# Patient Record
Sex: Female | Born: 1985 | Race: Black or African American | Hispanic: No | Marital: Married | Smoking: Never smoker
Health system: Southern US, Community
[De-identification: ages and names within clinical notes are randomized; demographics above are authoritative.]

## PROBLEM LIST (undated history)

## (undated) DIAGNOSIS — Z789 Other specified health status: Secondary | ICD-10-CM

## (undated) HISTORY — PX: NO PAST SURGERIES: SHX2092

## (undated) HISTORY — DX: Other specified health status: Z78.9

---

## 2020-04-06 NOTE — L&D Delivery Note (Signed)
OB/GYN Faculty Practice Delivery Note  Vicki Waters is a 35 y.o. [id number] s/p SVD at [redacted]w[redacted]d. She was admitted for PROM and early labor.   ROM: 11h 48m with clear fluid GBS Status: Negative Maximum Maternal Temperature: 98.8  Labor Progress: Presented for PROM, was started on pitocin and progressed to complete  Delivery Date/Time: 2252 on 02/28/2021 Delivery: Called to room and patient was complete and pushing. Head delivered LOA. No nuchal cord present. Shoulder and body delivered in usual fashion. Infant with spontaneous cry, placed on mother's abdomen, dried and stimulated. Cord clamped x 2 after 1-minute delay, and cut by father of baby. Cord blood drawn. Placenta delivered spontaneously with gentle cord traction. Fundus firm with massage and Pitocin. Labia, perineum, vagina, and cervix inspected inspected and found to have a second degree laceration that was repaired with 3-0 vicryl in running fashion  Placenta: intact, 3V cord, to L&D Complications: NONE Lacerations: 2nd degree EBL: 100cc Analgesia: none   Infant: female  APGARs 7,9  2900g  Renard Matter, MD, MPH OB Fellow, [place] for [facility], [place] 02/28/2021, 9:49 AM

## 2020-09-24 ENCOUNTER — Encounter: Payer: Self-pay | Admitting: *Deleted

## 2020-10-08 ENCOUNTER — Encounter: Payer: Self-pay | Admitting: Certified Nurse Midwife

## 2020-10-08 ENCOUNTER — Other Ambulatory Visit (HOSPITAL_COMMUNITY)
Admission: RE | Admit: 2020-10-08 | Discharge: 2020-10-08 | Disposition: A | Discharge status: Home / Self Care | Payer: Self-pay | Source: Ambulatory Visit | Attending: Certified Nurse Midwife | Admitting: Certified Nurse Midwife

## 2020-10-08 ENCOUNTER — Other Ambulatory Visit: Payer: Self-pay

## 2020-10-08 ENCOUNTER — Ambulatory Visit (INDEPENDENT_AMBULATORY_CARE_PROVIDER_SITE_OTHER): Payer: Self-pay | Admitting: Certified Nurse Midwife

## 2020-10-08 VITALS — BP 103/67 | HR 87 | Wt 130.9 lb

## 2020-10-08 DIAGNOSIS — O0932 Supervision of pregnancy with insufficient antenatal care, second trimester: Secondary | ICD-10-CM

## 2020-10-08 DIAGNOSIS — Z3480 Encounter for supervision of other normal pregnancy, unspecified trimester: Secondary | ICD-10-CM | POA: Insufficient documentation

## 2020-10-08 DIAGNOSIS — Z3492 Encounter for supervision of normal pregnancy, unspecified, second trimester: Secondary | ICD-10-CM

## 2020-10-08 DIAGNOSIS — Z3A18 18 weeks gestation of pregnancy: Secondary | ICD-10-CM

## 2020-10-08 NOTE — Progress Notes (Signed)
   History:   Vicki Waters is a 35 y.o. G2P1001 at [redacted]w[redacted]d by LMP being seen today for her first obstetrical visit.  Her obstetrical history is significant for  nothing, her last pregnancy was uncomplicated. Last baby born in [place] . Patient does intend to breast feed. Pregnancy history fully reviewed.  Patient reports no complaints.   HISTORY: OB History  Gravida Para Term Preterm AB Living  2 1 1  0 0 1  SAB IAB Ectopic Multiple Live Births  0 0 0 0 1    # Outcome Date GA Lbr Len/2nd Weight Sex Delivery Anes PTL Lv  2 Current           1 Term 2018    M Vag-Spont   LIV    Does not think she has ever had a pap smear, will complete today.  Reviewed past medical, social and surgical histories.  No known drug allergies.  Pt not taking any medications  Review of Systems Pertinent items noted in HPI and remainder of comprehensive ROS otherwise negative. Physical Exam:   Vitals:   10/08/20 1600  BP: 103/67  Pulse: 87  Weight: 130 lb 14.4 oz (59.4 kg)   Fetal Heart Rate (bpm): 161  Constitutional: Well-developed, well-nourished pregnant female in no acute distress.  HEENT: PERRLA Skin: normal color and turgor, no rash Cardiovascular: normal rate & rhythm, no murmur Respiratory: normal effort, lung sounds clear throughout GI: Abd soft, non-tender, pos BS x 4, gravid appropriate for gestational age MS: Extremities nontender, no edema, normal ROM Neurologic: Alert and oriented x 4.  GU: no CVA tenderness Pelvic: NEFG, physiologic discharge, no blood, cervix clean. Pap/swabs collected FHR:   Assessment & Plan:  1. Supervision of low-risk pregnancy, second trimester - Doing well, starting to feel little fluttery fetal movement, nausea has subsided  2. [redacted] weeks gestation of pregnancy - Routine OB care - Culture, OB Urine - Cytology - PAP( [place])  3. Initial obstetric visit in second trimester Initial labs drawn. - CBC/D/Plt+RPR+Rh+ABO+RubIgG... - Genetic  Screening - Hemoglobin A1c - AFP, Serum, Open Spina Bifida Continue prenatal vitamins. Problem list reviewed and updated. Genetic Screening discussed, Natera and AFP ordered. Ultrasound discussed; fetal anatomic survey: ordered. Anticipatory guidance about prenatal visits given including labs, ultrasounds, and testing. Discussed usage of Babyscripts and virtual visits as additional source of managing and completing prenatal visits in midst of coronavirus and pandemic.   Encouraged to complete MyChart Registration for her ability to review results, send requests, and have questions addressed.  The nature of [place] [facility] Healthcare/Faculty Practice with multiple MDs and Advanced Practice Providers was explained to patient; also emphasized that residents, students are part of our team.  Routine obstetric precautions reviewed. Encouraged to seek out care at office or emergency room [facility] preferred) for urgent and/or emergent concerns. Return in about 4 weeks (around 11/05/2020) for IN-PERSON, LOB.     Gaylan Gerold, MSN, CNM, [place] Certified Nurse Midwife, [facility]

## 2020-10-09 NOTE — Addendum Note (Signed)
Addended by: Gaylan Gerold on: 10/09/2020 01:21 PM   Modules accepted: Orders

## 2020-10-10 LAB — AFP, SERUM, OPEN SPINA BIFIDA
AFP MoM: 0.74
AFP Value: 43.4 ng/mL
Gest. Age on Collection Date: 18.5 weeks
Maternal Age At EDD: 33 yr
OSBR Risk 1 IN: 10000
Test Results:: NEGATIVE
Weight: 130 [lb_av]

## 2020-10-10 LAB — CBC/D/PLT+RPR+RH+ABO+RUBIGG...
Antibody Screen: NEGATIVE
Basophils Absolute: 0 10*3/uL (ref 0.0–0.2)
Basos: 0 %
EOS (ABSOLUTE): 0.1 10*3/uL (ref 0.0–0.4)
Eos: 1 %
HCV Ab: <0.1 s/co ratio (ref 0.0–0.9)
HIV Screen 4th Generation wRfx: NONREACTIVE
Hematocrit: 34.2 % (ref 34.0–46.6)
Hemoglobin: 11.7 g/dL (ref 11.1–15.9)
Hepatitis B Surface Ag: NEGATIVE
Immature Grans (Abs): 0.1 10*3/uL (ref 0.0–0.1)
Immature Granulocytes: 1 %
Lymphocytes Absolute: 1.8 10*3/uL (ref 0.7–3.1)
Lymphs: 21 %
MCH: 28.6 pg (ref 26.6–33.0)
MCHC: 34.2 g/dL (ref 31.5–35.7)
MCV: 84 fL (ref 79–97)
Monocytes Absolute: 0.4 10*3/uL (ref 0.1–0.9)
Monocytes: 5 %
Neutrophils Absolute: 6.3 10*3/uL (ref 1.4–7.0)
Neutrophils: 72 %
Platelets: 246 10*3/uL (ref 150–450)
RBC: 4.09 x10E6/uL (ref 3.77–5.28)
RDW: 14.2 % (ref 11.7–15.4)
RPR Ser Ql: NONREACTIVE
Rh Factor: NEGATIVE
Rubella Antibodies, IGG: 6.29 index (ref >0.99)
WBC: 8.8 10*3/uL (ref 3.4–10.8)

## 2020-10-10 LAB — CYTOLOGY - PAP
Comment: NEGATIVE
Diagnosis: NEGATIVE
High risk HPV: NEGATIVE

## 2020-10-10 LAB — HCV INTERPRETATION

## 2020-10-10 LAB — CULTURE, OB URINE

## 2020-10-10 LAB — URINE CULTURE, OB REFLEX

## 2020-10-10 LAB — HEMOGLOBIN A1C
Est. average glucose Bld gHb Est-mCnc: 97 mg/dL
Hgb A1c MFr Bld: 5 % (ref 4.8–5.6)

## 2020-10-16 ENCOUNTER — Other Ambulatory Visit: Payer: Self-pay

## 2020-10-16 ENCOUNTER — Ambulatory Visit: Payer: Self-pay | Attending: Certified Nurse Midwife

## 2020-10-16 DIAGNOSIS — Z3492 Encounter for supervision of normal pregnancy, unspecified, second trimester: Secondary | ICD-10-CM | POA: Insufficient documentation

## 2020-10-16 DIAGNOSIS — O0932 Supervision of pregnancy with insufficient antenatal care, second trimester: Secondary | ICD-10-CM | POA: Insufficient documentation

## 2020-10-16 DIAGNOSIS — Z3A18 18 weeks gestation of pregnancy: Secondary | ICD-10-CM | POA: Insufficient documentation

## 2020-10-23 ENCOUNTER — Encounter: Payer: Self-pay | Admitting: *Deleted

## 2020-10-24 ENCOUNTER — Telehealth: Payer: Self-pay

## 2020-10-24 DIAGNOSIS — Z148 Genetic carrier of other disease: Secondary | ICD-10-CM | POA: Insufficient documentation

## 2020-10-24 NOTE — Telephone Encounter (Signed)
Called Pt to go over Horizon test results, Pt states she did see results online & just assumed that she would go over results at her upcoming Dr. Visit. Advised that if she wanted to speak with a Dietitian at [place], I could give her the number & she accepted.

## 2020-10-31 ENCOUNTER — Other Ambulatory Visit: Payer: Self-pay

## 2020-10-31 ENCOUNTER — Ambulatory Visit (INDEPENDENT_AMBULATORY_CARE_PROVIDER_SITE_OTHER): Payer: Self-pay

## 2020-10-31 VITALS — BP 108/67 | HR 103 | Wt 136.0 lb

## 2020-10-31 DIAGNOSIS — Z3492 Encounter for supervision of normal pregnancy, unspecified, second trimester: Secondary | ICD-10-CM

## 2020-10-31 DIAGNOSIS — O26899 Other specified pregnancy related conditions, unspecified trimester: Secondary | ICD-10-CM

## 2020-10-31 DIAGNOSIS — Z362 Encounter for other antenatal screening follow-up: Secondary | ICD-10-CM

## 2020-10-31 DIAGNOSIS — Z3A22 22 weeks gestation of pregnancy: Secondary | ICD-10-CM

## 2020-10-31 DIAGNOSIS — Z6791 Unspecified blood type, Rh negative: Secondary | ICD-10-CM

## 2020-10-31 NOTE — Progress Notes (Signed)
   PRENATAL VISIT NOTE  Subjective:  Vicki Waters is a 35 y.o. G2P1001 at 27w0dbeing seen today for ongoing prenatal care.  She is currently monitored for the following issues for this low-risk pregnancy and has Supervision of other normal pregnancy, antepartum and Carrier of spinal muscular atrophy on their problem list.  Patient reports no complaints.  Contractions: Not present. Vag. Bleeding: None.  Movement: Present. Denies leaking of fluid.   The following portions of the patient's history were reviewed and updated as appropriate: allergies, current medications, past family history, past medical history, past social history, past surgical history and problem list.   Objective:   Vitals:   10/31/20 0939  BP: 108/67  Pulse: (!) 103  Weight: 136 lb (61.7 kg)    Fetal Status: Fetal Heart Rate (bpm): 155   Movement: Present     General:  Alert, oriented and cooperative. Patient is in no acute distress.  Skin: Skin is warm and dry. No rash noted.   Cardiovascular: Normal heart rate noted  Respiratory: Normal respiratory effort, no problems with respiration noted  Abdomen: Soft, gravid, appropriate for gestational age.  Pain/Pressure: Present     Pelvic: Cervical exam deferred        Extremities: Normal range of motion.  Edema: None  Mental Status: Normal mood and affect. Normal behavior. Normal judgment and thought content.   Assessment and Plan:  Pregnancy: G2P1001 at 247w0d1. Supervision of low-risk pregnancy, second trimester Routine OB care. No concerns today Endorses active fetal movement   2. [redacted] weeks gestation of pregnancy Follow up anatomy ultrasound scheduled on 11/25/2020   3. Rh negative state in antepartum period Rhogam at 28 weeks   Preterm labor symptoms and general obstetric precautions including but not limited to vaginal bleeding, contractions, leaking of fluid and fetal movement were reviewed in detail with the patient. Please refer to After Visit  Summary for other counseling recommendations.     Return in about 4 weeks (around 11/28/2020).   Future Appointments  Date Time Provider [place]8/22/2022  8:00 AM [facility] US1 [facility] [place] 10/31/20 10:47 AM

## 2020-11-25 ENCOUNTER — Other Ambulatory Visit: Payer: Self-pay

## 2020-11-25 ENCOUNTER — Ambulatory Visit: Payer: Self-pay

## 2020-11-25 ENCOUNTER — Other Ambulatory Visit: Payer: Self-pay | Admitting: *Deleted

## 2020-11-25 DIAGNOSIS — Z362 Encounter for other antenatal screening follow-up: Secondary | ICD-10-CM | POA: Insufficient documentation

## 2020-12-04 DIAGNOSIS — Z6791 Unspecified blood type, Rh negative: Secondary | ICD-10-CM | POA: Insufficient documentation

## 2020-12-04 DIAGNOSIS — O26899 Other specified pregnancy related conditions, unspecified trimester: Secondary | ICD-10-CM | POA: Insufficient documentation

## 2020-12-05 ENCOUNTER — Encounter: Payer: Self-pay | Admitting: Family Medicine

## 2020-12-05 ENCOUNTER — Other Ambulatory Visit: Payer: Self-pay

## 2020-12-05 ENCOUNTER — Ambulatory Visit (INDEPENDENT_AMBULATORY_CARE_PROVIDER_SITE_OTHER): Payer: Self-pay | Admitting: Family Medicine

## 2020-12-05 VITALS — BP 111/70 | HR 93 | Wt 145.7 lb

## 2020-12-05 DIAGNOSIS — Z3A27 27 weeks gestation of pregnancy: Secondary | ICD-10-CM

## 2020-12-05 DIAGNOSIS — Z3480 Encounter for supervision of other normal pregnancy, unspecified trimester: Secondary | ICD-10-CM

## 2020-12-05 DIAGNOSIS — O3480 Maternal care for other abnormalities of pelvic organs, unspecified trimester: Secondary | ICD-10-CM

## 2020-12-05 DIAGNOSIS — O26899 Other specified pregnancy related conditions, unspecified trimester: Secondary | ICD-10-CM

## 2020-12-05 DIAGNOSIS — D279 Benign neoplasm of unspecified ovary: Secondary | ICD-10-CM

## 2020-12-05 DIAGNOSIS — Z6791 Unspecified blood type, Rh negative: Secondary | ICD-10-CM

## 2020-12-05 DIAGNOSIS — Z148 Genetic carrier of other disease: Secondary | ICD-10-CM

## 2020-12-05 DIAGNOSIS — Z23 Encounter for immunization: Secondary | ICD-10-CM

## 2020-12-05 MED ORDER — RHO D IMMUNE GLOBULIN 1500 UNIT/2ML IJ SOSY
300.0000 ug | PREFILLED_SYRINGE | Freq: Once | INTRAMUSCULAR | Status: AC
  Administered 2020-12-05: 300 ug via INTRAMUSCULAR

## 2020-12-05 NOTE — Progress Notes (Signed)
    Subjective:  Vicki Waters is a 35 y.o. G2P1001 at 63w0dbeing seen today for ongoing prenatal care.  She is currently monitored for the following issues for this low-risk pregnancy and has Supervision of other normal pregnancy, antepartum; Carrier of spinal muscular atrophy; and Rh negative state in antepartum period on their problem list.  Patient reports no complaints.  Contractions: Not present. Vag. Bleeding: None.  Movement: Present. Denies leaking of fluid.   She is Rh negative, believes FOB is RH positive.   The following portions of the patient's history were reviewed and updated as appropriate: allergies, current medications, past family history, past medical history, past social history, past surgical history and problem list.   Objective:   Vitals:   12/05/20 0829  BP: 111/70  Pulse: 93  Weight: 145 lb 11.2 oz (66.1 kg)    Fetal Status: Fetal Heart Rate (bpm): 154 Fundal Height: 27 cm Movement: Present     General:  Alert, oriented and cooperative. Patient is in no acute distress.  Skin: Skin is warm and dry. No rash noted.   Cardiovascular: Normal heart rate noted  Respiratory: Normal respiratory effort, no problems with respiration noted  Abdomen: Soft, gravid, appropriate for gestational age. Pain/Pressure: Absent     Pelvic:  Cervical exam deferred        Extremities: Normal range of motion.  Edema: None  Mental Status: Normal mood and affect. Normal behavior. Normal judgment and thought content.     Assessment and Plan:  Pregnancy: G2P1001 at 24w0d1. Supervision of other normal pregnancy, antepartum Doing well.  - RPR - HIV Antibody (routine testing w rflx) - CBC - Tdap vaccine greater than or equal to 7yo IM - Glucose Tolerance, 2 Hours w/1 Hour  2. [redacted] weeks gestation of pregnancy 3rd trimester labs as above.   3. Rh negative state in antepartum period RhoGAM administered today.   4. Carrier of spinal muscular atrophy FOB testing recommended.  Genetic counseling scheduled by MFM.   5. Ovarian dermoid cyst complicating pregnancy, antepartum Right location, two small cysts. She has occasion irritation in that area since being told they were present in 10/2020. Counseled again on s/sx of ovarian torsion.   Preterm labor symptoms and general obstetric precautions including but not limited to vaginal bleeding, contractions, leaking of fluid and fetal movement were reviewed in detail with the patient. Please refer to After Visit Summary for other counseling recommendations.   Return in about 3 weeks (around 12/26/2020) for [place]  BePatriciaann ClanDO

## 2020-12-05 NOTE — Progress Notes (Signed)
Patient declines flu shot at this time.   Altha Harm, CMA

## 2020-12-06 LAB — CBC
Hematocrit: 36.8 % (ref 34.0–46.6)
Hemoglobin: 11.9 g/dL (ref 11.1–15.9)
MCH: 27.7 pg (ref 26.6–33.0)
MCHC: 32.3 g/dL (ref 31.5–35.7)
MCV: 86 fL (ref 79–97)
Platelets: 235 x10E3/uL (ref 150–450)
RBC: 4.3 x10E6/uL (ref 3.77–5.28)
RDW: 13.2 % (ref 11.7–15.4)
WBC: 7.7 x10E3/uL (ref 3.4–10.8)

## 2020-12-06 LAB — HIV ANTIBODY (ROUTINE TESTING W REFLEX): HIV Screen 4th Generation wRfx: NONREACTIVE

## 2020-12-06 LAB — ANTIBODY SCREEN: Antibody Screen: NEGATIVE

## 2020-12-06 LAB — GLUCOSE TOLERANCE, 2 HOURS W/ 1HR
Glucose, 1 hour: 170 mg/dL (ref 65–179)
Glucose, 2 hour: 134 mg/dL (ref 65–152)
Glucose, Fasting: 90 mg/dL (ref 65–91)

## 2020-12-06 LAB — RPR: RPR Ser Ql: NONREACTIVE

## 2020-12-17 ENCOUNTER — Ambulatory Visit: Payer: Self-pay

## 2020-12-24 ENCOUNTER — Other Ambulatory Visit: Payer: Self-pay

## 2020-12-24 ENCOUNTER — Ambulatory Visit (INDEPENDENT_AMBULATORY_CARE_PROVIDER_SITE_OTHER): Payer: Self-pay | Admitting: Student

## 2020-12-24 VITALS — BP 115/72 | HR 96 | Wt 148.0 lb

## 2020-12-24 DIAGNOSIS — Z3A29 29 weeks gestation of pregnancy: Secondary | ICD-10-CM

## 2020-12-24 DIAGNOSIS — Z3480 Encounter for supervision of other normal pregnancy, unspecified trimester: Secondary | ICD-10-CM

## 2020-12-24 NOTE — Progress Notes (Signed)
Patient ID: Vicki Waters, female   DOB: May 18, 1985, 35 y.o.   MRN: [id number]   PRENATAL VISIT NOTE  Subjective:  Vicki Waters is a 35 y.o. G2P1001 at [redacted]w[redacted]d being seen today for ongoing prenatal care.  She is currently monitored for the following issues for this low-risk pregnancy and has Supervision of other normal pregnancy, antepartum; Carrier of spinal muscular atrophy; and Rh negative state in antepartum period on their problem list.  Patient reports no complaints.  Contractions: Not present. Vag. Bleeding: None.  Movement: Present. Denies leaking of fluid.   The following portions of the patient's history were reviewed and updated as appropriate: allergies, current medications, past family history, past medical history, past social history, past surgical history and problem list.   Objective:   Vitals:   12/24/20 1036  BP: 115/72  Pulse: 96  Weight: 148 lb (67.1 kg)    Fetal Status: Fetal Heart Rate (bpm): 160   Movement: Present     General:  Alert, oriented and cooperative. Patient is in no acute distress.  Skin: Skin is warm and dry. No rash noted.   Cardiovascular: Normal heart rate noted  Respiratory: Normal respiratory effort, no problems with respiration noted  Abdomen: Soft, gravid, appropriate for gestational age.  Pain/Pressure: Absent     Pelvic: Cervical exam deferred        Extremities: Normal range of motion.  Edema: None  Mental Status: Normal mood and affect. Normal behavior. Normal judgment and thought content.   Assessment and Plan:  Pregnancy: G2P1001 at [redacted]w[redacted]d 1. Supervision of other normal pregnancy, antepartum -doing well, no complaints -declined genetic counseling  -OB box updated -given information on IUD Preterm labor symptoms and general obstetric precautions including but not limited to vaginal bleeding, contractions, leaking of fluid and fetal movement were reviewed in detail with the patient. Please refer to After Visit Summary for other  counseling recommendations.   Return in about 3 weeks (around 01/14/2021), or LROb with [place].  No future appointments.  [place], CNM

## 2021-01-14 ENCOUNTER — Ambulatory Visit (INDEPENDENT_AMBULATORY_CARE_PROVIDER_SITE_OTHER): Payer: Self-pay | Admitting: Student

## 2021-01-14 ENCOUNTER — Other Ambulatory Visit: Payer: Self-pay

## 2021-01-14 VITALS — BP 113/70 | HR 109 | Wt 154.1 lb

## 2021-01-14 DIAGNOSIS — Z3480 Encounter for supervision of other normal pregnancy, unspecified trimester: Secondary | ICD-10-CM

## 2021-01-14 DIAGNOSIS — Z3A32 32 weeks gestation of pregnancy: Secondary | ICD-10-CM

## 2021-01-14 NOTE — Progress Notes (Signed)
   PRENATAL VISIT NOTE  Subjective:  Vicki Waters is a 35 y.o. G2P1001 at [redacted]w[redacted]d being seen today for ongoing prenatal care.  She is currently monitored for the following issues for this low-risk pregnancy and has Supervision of other normal pregnancy, antepartum; Carrier of spinal muscular atrophy; and Rh negative state in antepartum period on their problem list.  Patient reports no complaints.  Contractions: Not present. Vag. Bleeding: None.  Movement: Present. Denies leaking of fluid.   The following portions of the patient's history were reviewed and updated as appropriate: allergies, current medications, past family history, past medical history, past social history, past surgical history and problem list.   Objective:   Vitals:   01/14/21 1044  BP: 113/70  Pulse: (!) 109  Weight: 154 lb 1.6 oz (69.9 kg)    Fetal Status: Fetal Heart Rate (bpm): 165 Fundal Height: 31 cm Movement: Present     General:  Alert, oriented and cooperative. Patient is in no acute distress.  Skin: Skin is warm and dry. No rash noted.   Cardiovascular: Normal heart rate noted  Respiratory: Normal respiratory effort, no problems with respiration noted  Abdomen: Soft, gravid, appropriate for gestational age.  Pain/Pressure: Absent     Pelvic: Cervical exam deferred        Extremities: Normal range of motion.  Edema: Trace  Mental Status: Normal mood and affect. Normal behavior. Normal judgment and thought content.   Assessment and Plan:  Pregnancy: G2P1001 at [redacted]w[redacted]d 1. Supervision of other normal pregnancy, antepartum -doing well, no questions -discussed IUD; she is thinking about it. Discussed that she can get in the hospital before she leaves  Preterm labor symptoms and general obstetric precautions including but not limited to vaginal bleeding, contractions, leaking of fluid and fetal movement were reviewed in detail with the patient. Please refer to After Visit Summary for other counseling  recommendations.   Return in about 2 weeks (around 01/28/2021), or LROB wiht [place] or other APP.  Future Appointments  Date Time Provider [place]  02/03/2021 10:35 AM Caren Macadam, MD [facility]    [place], [place]

## 2021-02-03 ENCOUNTER — Other Ambulatory Visit: Payer: Self-pay

## 2021-02-03 ENCOUNTER — Ambulatory Visit (INDEPENDENT_AMBULATORY_CARE_PROVIDER_SITE_OTHER): Payer: Self-pay | Admitting: Family Medicine

## 2021-02-03 VITALS — BP 115/70 | HR 107 | Wt 158.1 lb

## 2021-02-03 DIAGNOSIS — Z3480 Encounter for supervision of other normal pregnancy, unspecified trimester: Secondary | ICD-10-CM

## 2021-02-03 DIAGNOSIS — Z6791 Unspecified blood type, Rh negative: Secondary | ICD-10-CM

## 2021-02-03 DIAGNOSIS — Z148 Genetic carrier of other disease: Secondary | ICD-10-CM

## 2021-02-03 DIAGNOSIS — O26899 Other specified pregnancy related conditions, unspecified trimester: Secondary | ICD-10-CM

## 2021-02-03 NOTE — Progress Notes (Signed)
   PRENATAL VISIT NOTE  Subjective:  Vicki Waters is a 35 y.o. G2P1001 at [redacted]w[redacted]d being seen today for ongoing prenatal care.  She is currently monitored for the following issues for this low-risk pregnancy and has Supervision of other normal pregnancy, antepartum; Carrier of spinal muscular atrophy; and Rh negative state in antepartum period on their problem list.  Patient reports no complaints.  Contractions: Not present. Vag. Bleeding: None.  Movement: Present. Denies leaking of fluid.   The following portions of the patient's history were reviewed and updated as appropriate: allergies, current medications, past family history, past medical history, past social history, past surgical history and problem list.   Objective:   Vitals:   02/03/21 1043  BP: 115/70  Pulse: (!) 107  Weight: 158 lb 1.6 oz (71.7 kg)    Fetal Status: Fetal Heart Rate (bpm): 147 Fundal Height: 35 cm Movement: Present     General:  Alert, oriented and cooperative. Patient is in no acute distress.  Skin: Skin is warm and dry. No rash noted.   Cardiovascular: Normal heart rate noted  Respiratory: Normal respiratory effort, no problems with respiration noted  Abdomen: Soft, gravid, appropriate for gestational age.  Pain/Pressure: Absent     Pelvic: Cervical exam deferred        Extremities: Normal range of motion.  Edema: Trace  Mental Status: Normal mood and affect. Normal behavior. Normal judgment and thought content.   Assessment and Plan:  Pregnancy: G2P1001 at [redacted]w[redacted]d 1. Rh negative state in antepartum period Received rhogam  2. Supervision of other normal pregnancy, antepartum Up to date Has some minor spotting at 34 weeks- 1 spot on toilet paper and pink for a couple days.  Discussed labs next visit Reviewed contraception in depth Discussed IUD vs Nexplanon. Patient has IUD in [place]- seems likely it was copper IUD as ti caused increased bleeding/cramping but patient unsure. Gave bedsider website to  review Plans to BF  3. Carrier of spinal muscular atrophy   Preterm labor symptoms and general obstetric precautions including but not limited to vaginal bleeding, contractions, leaking of fluid and fetal movement were reviewed in detail with the patient. Please refer to After Visit Summary for other counseling recommendations.   Return in about 1 week (around 02/10/2021) for Routine prenatal care, MD or APP.  Future Appointments  Date Time Provider [place]  02/12/2021  1:55 PM Rasch, Artist Pais, NP [facility]  02/19/2021 10:15 AM Radene Gunning, MD [facility]    Caren Macadam, MD

## 2021-02-12 ENCOUNTER — Ambulatory Visit (INDEPENDENT_AMBULATORY_CARE_PROVIDER_SITE_OTHER): Payer: Medicaid Other | Admitting: Obstetrics and Gynecology

## 2021-02-12 ENCOUNTER — Other Ambulatory Visit (HOSPITAL_COMMUNITY)
Admission: RE | Admit: 2021-02-12 | Discharge: 2021-02-12 | Disposition: A | Discharge status: Home / Self Care | Payer: Self-pay | Source: Ambulatory Visit | Attending: Obstetrics and Gynecology | Admitting: Obstetrics and Gynecology

## 2021-02-12 ENCOUNTER — Other Ambulatory Visit: Payer: Self-pay

## 2021-02-12 VITALS — BP 119/78 | HR 115 | Wt 158.8 lb

## 2021-02-12 DIAGNOSIS — Z3480 Encounter for supervision of other normal pregnancy, unspecified trimester: Secondary | ICD-10-CM | POA: Insufficient documentation

## 2021-02-12 DIAGNOSIS — Z23 Encounter for immunization: Secondary | ICD-10-CM | POA: Diagnosis not present

## 2021-02-12 NOTE — Progress Notes (Signed)
Flu vaccine was requested by patient. Routine allergy questions were asked and vaccine was administered into right deltoid without any complications. There were questions or concerns.    Zella Richer, [place]   02/12/21  3:06pm

## 2021-02-12 NOTE — Progress Notes (Signed)
   PRENATAL VISIT NOTE  Subjective:  Vicki Waters is a 35 y.o. G2P1001 at [redacted]w[redacted]d being seen today for ongoing prenatal care.  She is currently monitored for the following issues for this low-risk pregnancy and has Supervision of other normal pregnancy, antepartum; Carrier of spinal muscular atrophy; and Rh negative state in antepartum period on their problem list.  Patient reports no complaints.  Contractions: Not present. Vag. Bleeding: None.  Movement: Present. Denies leaking of fluid.   The following portions of the patient's history were reviewed and updated as appropriate: allergies, current medications, past family history, past medical history, past social history, past surgical history and problem list.   Objective:   Vitals:   02/12/21 1443  BP: 119/78  Pulse: (!) 115  Weight: 158 lb 12.8 oz (72 kg)    Fetal Status: Fetal Heart Rate (bpm): 159 Fundal Height: 37 cm Movement: Present     General:  Alert, oriented and cooperative. Patient is in no acute distress.  Skin: Skin is warm and dry. No rash noted.   Cardiovascular: Normal heart rate noted  Respiratory: Normal respiratory effort, no problems with respiration noted  Abdomen: Soft, gravid, appropriate for gestational age.  Pain/Pressure: Absent     Pelvic: Cervical exam deferred        Extremities: Normal range of motion.  Edema: Trace  Mental Status: Normal mood and affect. Normal behavior. Normal judgment and thought content.   Assessment and Plan:  Pregnancy: G2P1001 at [redacted]w[redacted]d  1. Supervision of other normal pregnancy, antepartum  - Doing well, no complaints.  - Culture, beta strep (group b only) - GC/Chlamydia probe amp ([place])not at [facility]    Preterm labor symptoms and general obstetric precautions including but not limited to vaginal bleeding, contractions, leaking of fluid and fetal movement were reviewed in detail with the patient. Please refer to After Visit Summary for other counseling  recommendations.   No follow-ups on file.  Future Appointments  Date Time Provider [place]  02/19/2021 10:15 AM Radene Gunning, MD [facility] [place], NP

## 2021-02-13 LAB — GC/CHLAMYDIA PROBE AMP (~~LOC~~) NOT AT ARMC
Chlamydia: NEGATIVE
Comment: NEGATIVE
Comment: NORMAL
Neisseria Gonorrhea: NEGATIVE

## 2021-02-15 LAB — CULTURE, BETA STREP (GROUP B ONLY): Strep Gp B Culture: NEGATIVE

## 2021-02-18 NOTE — Progress Notes (Signed)
   PRENATAL VISIT NOTE  Subjective:  Vicki Waters is a 35 y.o. G2P1001 at [redacted]w[redacted]d being seen today for ongoing prenatal care.  She is currently monitored for the following issues for this low-risk pregnancy and has Supervision of other normal pregnancy, antepartum; Carrier of spinal muscular atrophy; and Rh negative state in antepartum period on their problem list.  Patient reports  right foot is swollen. Denies calf pain and notes legs are the same size .  Contractions: Irritability. Vag. Bleeding: None.  Movement: Present. Denies leaking of fluid.   The following portions of the patient's history were reviewed and updated as appropriate: allergies, current medications, past family history, past medical history, past social history, past surgical history and problem list.   Objective:   Vitals:   02/19/21 0931  BP: 117/84  Pulse: (!) 112  Weight: 159 lb (72.1 kg)    Fetal Status: Fetal Heart Rate (bpm): 156 Fundal Height: 37 cm Movement: Present     General:  Alert, oriented and cooperative. Patient is in no acute distress.  Skin: Skin is warm and dry. No rash noted.   Cardiovascular: Normal heart rate noted  Respiratory: Normal respiratory effort, no problems with respiration noted  Abdomen: Soft, gravid, appropriate for gestational age.  Pain/Pressure: Absent     Pelvic: Cervical exam deferred        Extremities: Normal range of motion.  Edema: None  Mental Status: Normal mood and affect. Normal behavior. Normal judgment and thought content.   Assessment and Plan:  Pregnancy: G2P1001 at [redacted]w[redacted]d 1. Supervision of other normal pregnancy, antepartum - GBS negative - Plans labor without epidural - Discussed birth control - she would like cuIUD. She is undecided about ppIUD vs outpt. We discussed the pros/cons of each one and she will consider.   2. Carrier of spinal muscular atrophy - She was offered genetic counseling but they ultimately declined  3. Rh negative state in  antepartum period - Rhogam given per routine  Term labor symptoms and general obstetric precautions including but not limited to vaginal bleeding, contractions, leaking of fluid and fetal movement were reviewed in detail with the patient. Please refer to After Visit Summary for other counseling recommendations.   Return in about 1 week (around 02/26/2021) for OB VISIT, MD or APP - may be virtual.  Future Appointments  Date Time Provider [place]  02/19/2021 10:15 AM Radene Gunning, MD [facility]    Radene Gunning, MD

## 2021-02-19 ENCOUNTER — Ambulatory Visit (INDEPENDENT_AMBULATORY_CARE_PROVIDER_SITE_OTHER): Payer: Self-pay | Admitting: Obstetrics and Gynecology

## 2021-02-19 ENCOUNTER — Other Ambulatory Visit: Payer: Self-pay

## 2021-02-19 ENCOUNTER — Encounter: Payer: Self-pay | Admitting: Obstetrics and Gynecology

## 2021-02-19 VITALS — BP 117/84 | HR 112 | Wt 159.0 lb

## 2021-02-19 DIAGNOSIS — O26899 Other specified pregnancy related conditions, unspecified trimester: Secondary | ICD-10-CM

## 2021-02-19 DIAGNOSIS — Z6791 Unspecified blood type, Rh negative: Secondary | ICD-10-CM

## 2021-02-19 DIAGNOSIS — Z148 Genetic carrier of other disease: Secondary | ICD-10-CM

## 2021-02-19 DIAGNOSIS — Z3480 Encounter for supervision of other normal pregnancy, unspecified trimester: Secondary | ICD-10-CM

## 2021-02-26 ENCOUNTER — Telehealth (INDEPENDENT_AMBULATORY_CARE_PROVIDER_SITE_OTHER): Payer: Self-pay | Admitting: Family Medicine

## 2021-02-26 DIAGNOSIS — Z3A38 38 weeks gestation of pregnancy: Secondary | ICD-10-CM

## 2021-02-26 DIAGNOSIS — O360130 Maternal care for anti-D [Rh] antibodies, third trimester, not applicable or unspecified: Secondary | ICD-10-CM

## 2021-02-26 DIAGNOSIS — Z6791 Unspecified blood type, Rh negative: Secondary | ICD-10-CM

## 2021-02-26 DIAGNOSIS — Z3480 Encounter for supervision of other normal pregnancy, unspecified trimester: Secondary | ICD-10-CM

## 2021-02-26 DIAGNOSIS — Z148 Genetic carrier of other disease: Secondary | ICD-10-CM

## 2021-02-26 NOTE — Progress Notes (Signed)
I connected with  Vicki Waters on 02/26/21 at 1530 by telephone and verified that I am speaking with the correct person using two identifiers.Patient instructed to join MyChart video.   Patient joined video visit. I discussed the limitations, risks, security and privacy concerns of performing an evaluation and management service by telephone and the availability of in person appointments. I also discussed with the patient that there may be a patient responsible charge related to this service. The patient expressed understanding and agreed to proceed.  Annabell Howells, RN 02/26/2021  3:32 PM

## 2021-02-26 NOTE — Progress Notes (Signed)
   OBSTETRICS PRENATAL VIRTUAL VISIT ENCOUNTER NOTE  Provider location: [facility] [place]   Patient location: Home  I connected with Vicki Waters on 02/26/21 at  3:55 PM EST by MyChart Video Encounter and verified that I am speaking with the correct person using two identifiers. I discussed the limitations, risks, security and privacy concerns of performing an evaluation and management service virtually and the availability of in person appointments. I also discussed with the patient that there may be a patient responsible charge related to this service. The patient expressed understanding and agreed to proceed. Subjective:  Vicki Waters is a 35 y.o. G2P1001 at [redacted]w[redacted]d being seen today for ongoing prenatal care.  She is currently monitored for the following issues for this low-risk pregnancy and has Supervision of other normal pregnancy, antepartum; Carrier of spinal muscular atrophy; and Rh negative state in antepartum period on their problem list.  Patient reports occasional contractions.  Contractions: Irritability. Vag. Bleeding: None.  Movement: Present. Denies any leaking of fluid.   The following portions of the patient's history were reviewed and updated as appropriate: allergies, current medications, past family history, past medical history, past social history, past surgical history and problem list.   Objective:  There were no vitals filed for this visit.  Fetal Status:     Movement: Present     General:  Alert, oriented and cooperative. Patient is in no acute distress.  Respiratory: Normal respiratory effort, no problems with respiration noted  Mental Status: Normal mood and affect. Normal behavior. Normal judgment and thought content.  Rest of physical exam deferred due to type of encounter  Imaging: No results found.  Assessment and Plan:  Pregnancy: G2P1001 at [redacted]w[redacted]d 1. Supervision of other normal pregnancy, antepartum Good fetal  movement  2. Carrier of spinal muscular atrophy  3. Rh negative state in antepartum period Rhogam at 28 weeks  Term labor symptoms and general obstetric precautions including but not limited to vaginal bleeding, contractions, leaking of fluid and fetal movement were reviewed in detail with the patient. I discussed the assessment and treatment plan with the patient. The patient was provided an opportunity to ask questions and all were answered. The patient agreed with the plan and demonstrated an understanding of the instructions. The patient was advised to call back or seek an in-person office evaluation/go to MAU at [facility] for any urgent or concerning symptoms. Please refer to After Visit Summary for other counseling recommendations.   I provided 5 minutes of face-to-face time during this encounter.  No follow-ups on file.  Future Appointments  Date Time Provider [place]  02/26/2021  3:55 PM Truett Mainland, DO [facility] [place] for [employer], [place]

## 2021-02-27 ENCOUNTER — Other Ambulatory Visit: Payer: Self-pay

## 2021-02-27 ENCOUNTER — Encounter (HOSPITAL_COMMUNITY): Payer: Self-pay | Admitting: Obstetrics & Gynecology

## 2021-02-27 ENCOUNTER — Inpatient Hospital Stay (HOSPITAL_COMMUNITY)
Admission: AD | Admit: 2021-02-27 | Discharge: 2021-03-01 | DRG: 807 | Disposition: A | Discharge status: Home / Self Care | Payer: Medicaid Other | Attending: Obstetrics & Gynecology | Admitting: Obstetrics & Gynecology

## 2021-02-27 DIAGNOSIS — O4292 Full-term premature rupture of membranes, unspecified as to length of time between rupture and onset of labor: Secondary | ICD-10-CM | POA: Diagnosis present

## 2021-02-27 DIAGNOSIS — O4202 Full-term premature rupture of membranes, onset of labor within 24 hours of rupture: Secondary | ICD-10-CM | POA: Diagnosis not present

## 2021-02-27 DIAGNOSIS — Z20822 Contact with and (suspected) exposure to covid-19: Secondary | ICD-10-CM | POA: Diagnosis present

## 2021-02-27 DIAGNOSIS — Z3480 Encounter for supervision of other normal pregnancy, unspecified trimester: Secondary | ICD-10-CM

## 2021-02-27 DIAGNOSIS — Z6791 Unspecified blood type, Rh negative: Secondary | ICD-10-CM

## 2021-02-27 DIAGNOSIS — Z3A39 39 weeks gestation of pregnancy: Secondary | ICD-10-CM

## 2021-02-27 DIAGNOSIS — O26893 Other specified pregnancy related conditions, third trimester: Secondary | ICD-10-CM | POA: Diagnosis present

## 2021-02-27 DIAGNOSIS — Z148 Genetic carrier of other disease: Secondary | ICD-10-CM | POA: Diagnosis not present

## 2021-02-27 DIAGNOSIS — O26899 Other specified pregnancy related conditions, unspecified trimester: Secondary | ICD-10-CM

## 2021-02-27 LAB — RESP PANEL BY RT-PCR (FLU A&B, COVID) ARPGX2
Influenza A by PCR: NEGATIVE
Influenza B by PCR: NEGATIVE
SARS Coronavirus 2 by RT PCR: NEGATIVE

## 2021-02-27 LAB — CBC
HCT: 37.9 % (ref 36.0–46.0)
Hemoglobin: 12.7 g/dL (ref 12.0–15.0)
MCH: 27.8 pg (ref 26.0–34.0)
MCHC: 33.5 g/dL (ref 30.0–36.0)
MCV: 82.9 fL (ref 80.0–100.0)
Platelets: 215 10*3/uL (ref 150–400)
RBC: 4.57 MIL/uL (ref 3.87–5.11)
RDW: 13.7 % (ref 11.5–15.5)
WBC: 7.3 10*3/uL (ref 4.0–10.5)
nRBC: 0 % (ref 0.0–0.2)

## 2021-02-27 LAB — TYPE AND SCREEN
ABO/RH(D): O NEG
Antibody Screen: NEGATIVE

## 2021-02-27 LAB — POCT FERN TEST: POCT Fern Test: POSITIVE

## 2021-02-27 MED ORDER — LACTATED RINGERS IV SOLN: INTRAVENOUS | Status: DC

## 2021-02-27 MED ORDER — FENTANYL CITRATE (PF) 100 MCG/2ML IJ SOLN: 50.0000 ug | INTRAMUSCULAR | Status: DC | PRN

## 2021-02-27 MED ORDER — LIDOCAINE HCL (PF) 1 % IJ SOLN
30.0000 mL | INTRAMUSCULAR | Status: AC | PRN
  Administered 2021-02-27: 30 mL via SUBCUTANEOUS
  Filled 2021-02-27: qty 30

## 2021-02-27 MED ORDER — SOD CITRATE-CITRIC ACID 500-334 MG/5ML PO SOLN: 30.0000 mL | ORAL | Status: DC | PRN

## 2021-02-27 MED ORDER — OXYTOCIN-SODIUM CHLORIDE 30-0.9 UT/500ML-% IV SOLN
1.0000 m[IU]/min | INTRAVENOUS | Status: DC
  Administered 2021-02-27: 2 m[IU]/min via INTRAVENOUS

## 2021-02-27 MED ORDER — HYDROXYZINE HCL 50 MG PO TABS: 50.0000 mg | ORAL_TABLET | Freq: Four times a day (QID) | ORAL | Status: DC | PRN

## 2021-02-27 MED ORDER — OXYCODONE-ACETAMINOPHEN 5-325 MG PO TABS: 1.0000 | ORAL_TABLET | ORAL | Status: DC | PRN

## 2021-02-27 MED ORDER — LACTATED RINGERS IV SOLN: 500.0000 mL | INTRAVENOUS | Status: DC | PRN

## 2021-02-27 MED ORDER — FLEET ENEMA 7-19 GM/118ML RE ENEM: 1.0000 | ENEMA | RECTAL | Status: DC | PRN

## 2021-02-27 MED ORDER — OXYTOCIN BOLUS FROM INFUSION
333.0000 mL | Freq: Once | INTRAVENOUS | Status: AC
  Administered 2021-02-27: 333 mL via INTRAVENOUS

## 2021-02-27 MED ORDER — ACETAMINOPHEN 325 MG PO TABS: 650.0000 mg | ORAL_TABLET | ORAL | Status: DC | PRN

## 2021-02-27 MED ORDER — OXYTOCIN-SODIUM CHLORIDE 30-0.9 UT/500ML-% IV SOLN
2.5000 [IU]/h | INTRAVENOUS | Status: DC
  Filled 2021-02-27: qty 500

## 2021-02-27 MED ORDER — TERBUTALINE SULFATE 1 MG/ML IJ SOLN: 0.2500 mg | Freq: Once | INTRAMUSCULAR | Status: DC | PRN

## 2021-02-27 MED ORDER — ONDANSETRON HCL 4 MG/2ML IJ SOLN: 4.0000 mg | Freq: Four times a day (QID) | INTRAMUSCULAR | Status: DC | PRN

## 2021-02-27 MED ORDER — OXYCODONE-ACETAMINOPHEN 5-325 MG PO TABS: 2.0000 | ORAL_TABLET | ORAL | Status: DC | PRN

## 2021-02-27 NOTE — H&P (Addendum)
Vicki Waters is a 35 y.o. female G2P1001 with IUP at [redacted]w[redacted]d by LMP presenting for ROM at 1150.  Not feeling any contractions.  She reports positive fetal movement. She denies vaginal bleeding.  Prenatal History/Complications: PNC at [facility] Pregnancy complications:  - Past Medical History: Past Medical History:  Diagnosis Date   Medical history non-contributory     Past Surgical History: Past Surgical History:  Procedure Laterality Date   NO PAST SURGERIES      Obstetrical History: OB History     Gravida  2   Para  1   Term  1   Preterm      AB      Living  1      SAB      IAB      Ectopic      Multiple      Live Births  1            Social History: Social History   Socioeconomic History   Marital status: Married    Spouse name: Glynda Jaeger   Number of children: 1   Years of education: Not on file   Highest education level: Not on file  Occupational History   Not on file  Tobacco Use   Smoking status: Never   Smokeless tobacco: Never  Vaping Use   Vaping Use: Never used  Substance and Sexual Activity   Alcohol use: Never   Drug use: Never   Sexual activity: Yes  Other Topics Concern   Not on file  Social History Narrative   Not on file   Social Determinants of Health   Financial Resource Strain: Not on file  Food Insecurity: Not on file  Transportation Needs: Not on file  Physical Activity: Not on file  Stress: Not on file  Social Connections: Not on file    Family History: History reviewed. No pertinent family history.  Allergies: No Known Allergies  Medications Prior to Admission  Medication Sig Dispense Refill Last Dose   Prenatal Vit-Fe Fumarate-FA (MULTIVITAMIN-PRENATAL) 27-0.8 MG TABS tablet Take 1 tablet by mouth daily at 12 noon.   Past Week    Review of Systems   Constitutional: Negative for fever and chills Eyes: Negative for visual disturbances Respiratory: Negative for shortness of breath,  dyspnea Cardiovascular: Negative for chest pain or palpitations  Gastrointestinal: Negative for vomiting, diarrhea and constipation.  POSITIVE for abdominal pain (contractions) Genitourinary: Negative for dysuria and urgency Musculoskeletal: Negative for back pain, joint pain, myalgias  Neurological: Negative for dizziness and headaches  Blood pressure 135/73, pulse 98, temperature 98.1 F (36.7 C), temperature source Oral, resp. rate 16, height 5' 4.17" (1.63 m), weight 71.7 kg, last menstrual period 05/30/2020, SpO2 98 %. General appearance: alert, cooperative, and no distress Lungs: normal respiratory effort Heart: regular rate and rhythm Abdomen: soft, non-tender; bowel sounds normal Extremities: Homans sign is negative, no sign of DVT DTR's 2+ Presentation: cephalic Fetal monitoring  Baseline: 150 bpm, Variability: Good {> 6 bpm), Accelerations: Reactive, and Decelerations: Absent Uterine activity  None Dilation: 3 Effacement (%): 60 Station: -2 Exam by:: Mary Martinique Johnson, RN   Prenatal labs: ABO, Rh: --/--/O NEG (11/24 1324) Antibody: NEG (11/24 1324) Rubella: 6.29 (07/05 1647) RPR: Non Reactive (09/01 0909)  HBsAg: Negative (07/05 1647)  HIV: Non Reactive (09/01 0909)  GBS: Negative/-- (11/09 1515)   Nursing Staff Provider  Office Location  [facility] Dating  LMP  Language  English Anatomy US   normal  Flu  Vaccine  02/12/21 Genetic/Carrier Screen  NIPS:   Low risk female  AFP:   Negative  Horizon: Carrier for Automatic Data   TDaP Vaccine  12/05/20 Hgb A1C or  GTT Early  Third trimester  & Units 1 mo ago    Glucose, Fasting 65 - 91 mg/dL 90   Glucose, 1 hour 65 - 179 mg/dL 170   Glucose, 2 hour 65 - 152 mg/dL 134      COVID Vaccine 09/04/20   LAB RESULTS   Rhogam  12/05/20 Blood Type O/Negative/-- (07/05 1647)   Baby Feeding Plan Breast Antibody Negative (09/01 0909)  Contraception CuIUD Rubella 6.29 (07/05 1647)  Circumcision No RPR Non Reactive (09/01 0909)   Pediatrician   Mustard seed HBsAg Negative (07/05 1647)   Support Person wendwosen (FOB) HCVAb   Prenatal Classes  HIV Non Reactive (09/01 0909)     BTL Consent NA GBS   (For PCN allergy, check sensitivities)   VBAC Consent NA Pap 10/2020- Normal        DME Rx [ ]  BP cuff [ ]  Weight Scale Waterbirth  [ ]  Class [ ]  Consent [ ]  CNM visit  PHQ9 & GAD7 [x  ] new OB [  ] 28 weeks  [  ] 36 weeks Induction  [ ]  Orders Entered [ ] Foley Y/N   Prenatal Transfer Tool  Maternal Diabetes: No Genetic Screening: Normal Maternal Ultrasounds/Referrals: Normal Fetal Ultrasounds or other Referrals:  None Maternal Substance Abuse:  No Significant Maternal Medications:  None Significant Maternal Lab Results: Group B Strep negative  Results for orders placed or performed during the hospital encounter of 02/27/21 (from the past 24 hour(s))  Resp Panel by RT-PCR (Flu A&B, Covid) Nasopharyngeal Swab   Collection Time: 02/27/21  1:24 PM   Specimen: Nasopharyngeal Swab; Nasopharyngeal(NP) swabs in vial transport medium  Result Value Ref Range   SARS Coronavirus 2 by RT PCR NEGATIVE NEGATIVE   Influenza A by PCR NEGATIVE NEGATIVE   Influenza B by PCR NEGATIVE NEGATIVE  Type and screen [place]   Collection Time: 02/27/21  1:24 PM  Result Value Ref Range   ABO/RH(D) O NEG    Antibody Screen NEG    Sample Expiration      03/02/2021,2359 Performed at [facility], [address], [place], [place] [address]   POCT fern test   Collection Time: 02/27/21  1:33 PM  Result Value Ref Range   POCT Fern Test Positive = ruptured amniotic membanes   CBC   Collection Time: 02/27/21  1:34 PM  Result Value Ref Range   WBC 7.3 4.0 - 10.5 K/uL   RBC 4.57 3.87 - 5.11 MIL/uL   Hemoglobin 12.7 12.0 - 15.0 g/dL   HCT 37.9 36.0 - 46.0 %   MCV 82.9 80.0 - 100.0 fL   MCH 27.8 26.0 - 34.0 pg   MCHC 33.5 30.0 - 36.0 g/dL   RDW 13.7 11.5 - 15.5 %   Platelets 215 150 - 400 K/uL   nRBC 0.0 0.0 - 0.2 %     Assessment: Vicki Waters is a 35 y.o. G2P1001 with an IUP at [redacted]w[redacted]d presenting for PROM  Plan: #Labor: discussed expectant mgt vs IOL, pt agreeable to IOL>Pitocin #Pain:  Per request #FWB Cat 1 #ID: GBS:    Christin Fudge 02/27/2021, 5:13 PM

## 2021-02-27 NOTE — MAU Note (Signed)
Vicki Waters is a 35 y.o. at [redacted]w[redacted]d here in MAU reporting: LOF for the past hour, states it was a big gush and is still leaking. Fluid is yellow. Denies bleeding or pain.  Onset of complaint: today  Pain score: 0/10  Vitals:   02/27/21 1310  BP: 137/84  Pulse: (!) 107  Resp: 18  Temp: 98.8 F (37.1 C)  SpO2: 98%     FHT: +FM, EFM applied in room  Lab orders placed from triage: fern slide

## 2021-02-27 NOTE — MAU Provider Note (Signed)
Vicki Waters is a 35 y.o. G2P1001 female at [redacted]w[redacted]d weeks gestation presenting to MAU with complaints of SROM. MAU provider was requested by RN to verify presentation.  NST - FHR: 145 bpm / moderate variability / accels present / decels absent / TOCO: regular every 4 mins   Patient informed that the ultrasound is considered a limited OB ultrasound and is not intended to be a complete ultrasound exam.  Patient also informed that the ultrasound is not being completed with the intent of assessing for fetal or placental anomalies or any pelvic abnormalities.  Explained that the purpose of today's ultrasound is to assess for presentation.  Baby was found to be in a cephalic presentation. Patient acknowledges the purpose of the exam and the limitations of the study.  Laury Deep, CNM  02/27/2021 1:15 PM

## 2021-02-28 ENCOUNTER — Encounter (HOSPITAL_COMMUNITY): Payer: Self-pay | Admitting: Obstetrics & Gynecology

## 2021-02-28 LAB — RPR: RPR Ser Ql: NONREACTIVE

## 2021-02-28 MED ORDER — RHO D IMMUNE GLOBULIN 1500 UNIT/2ML IJ SOSY
300.0000 ug | PREFILLED_SYRINGE | Freq: Once | INTRAMUSCULAR | Status: AC
  Administered 2021-02-28: 300 ug via INTRAVENOUS
  Filled 2021-02-28: qty 2

## 2021-02-28 MED ORDER — ONDANSETRON HCL 4 MG PO TABS: 4.0000 mg | ORAL_TABLET | ORAL | Status: DC | PRN

## 2021-02-28 MED ORDER — PRENATAL MULTIVITAMIN CH
1.0000 | ORAL_TABLET | Freq: Every day | ORAL | Status: DC
  Administered 2021-02-28 – 2021-03-01 (×2): 1 via ORAL
  Filled 2021-02-28 (×2): qty 1

## 2021-02-28 MED ORDER — DIBUCAINE (PERIANAL) 1 % EX OINT: 1.0000 "application " | TOPICAL_OINTMENT | CUTANEOUS | Status: DC | PRN

## 2021-02-28 MED ORDER — SENNOSIDES-DOCUSATE SODIUM 8.6-50 MG PO TABS
2.0000 | ORAL_TABLET | Freq: Every day | ORAL | Status: DC
  Administered 2021-03-01: 2 via ORAL
  Filled 2021-02-28: qty 2

## 2021-02-28 MED ORDER — TETANUS-DIPHTH-ACELL PERTUSSIS 5-2.5-18.5 LF-MCG/0.5 IM SUSY: 0.5000 mL | PREFILLED_SYRINGE | Freq: Once | INTRAMUSCULAR | Status: DC

## 2021-02-28 MED ORDER — SIMETHICONE 80 MG PO CHEW: 80.0000 mg | CHEWABLE_TABLET | ORAL | Status: DC | PRN

## 2021-02-28 MED ORDER — MEASLES, MUMPS & RUBELLA VAC IJ SOLR: 0.5000 mL | Freq: Once | INTRAMUSCULAR | Status: DC

## 2021-02-28 MED ORDER — WITCH HAZEL-GLYCERIN EX PADS: 1.0000 "application " | MEDICATED_PAD | CUTANEOUS | Status: DC | PRN

## 2021-02-28 MED ORDER — DIPHENHYDRAMINE HCL 25 MG PO CAPS: 25.0000 mg | ORAL_CAPSULE | Freq: Four times a day (QID) | ORAL | Status: DC | PRN

## 2021-02-28 MED ORDER — IBUPROFEN 600 MG PO TABS
600.0000 mg | ORAL_TABLET | Freq: Four times a day (QID) | ORAL | Status: DC
  Administered 2021-02-28 – 2021-03-01 (×6): 600 mg via ORAL
  Filled 2021-02-28 (×6): qty 1

## 2021-02-28 MED ORDER — COCONUT OIL OIL: 1.0000 "application " | TOPICAL_OIL | Status: DC | PRN

## 2021-02-28 MED ORDER — ONDANSETRON HCL 4 MG/2ML IJ SOLN: 4.0000 mg | INTRAMUSCULAR | Status: DC | PRN

## 2021-02-28 MED ORDER — MEDROXYPROGESTERONE ACETATE 150 MG/ML IM SUSP: 150.0000 mg | INTRAMUSCULAR | Status: DC | PRN

## 2021-02-28 MED ORDER — BENZOCAINE-MENTHOL 20-0.5 % EX AERO
1.0000 "application " | INHALATION_SPRAY | CUTANEOUS | Status: DC | PRN
  Administered 2021-02-28: 1 via TOPICAL
  Filled 2021-02-28: qty 56

## 2021-02-28 MED ORDER — ACETAMINOPHEN 325 MG PO TABS: 650.0000 mg | ORAL_TABLET | ORAL | Status: DC | PRN

## 2021-02-28 NOTE — Progress Notes (Signed)
Patient ID: Vicki Waters, female   DOB: [birth year], 35 y.o.   MRN: [id number]  POSTPARTUM PROGRESS NOTE  Post Partum Day 1  Subjective:  Lakshmi Lein is a 35 y.o. [id number] s/p SVD at [redacted]w[redacted]d.  No acute events overnight.  Pt denies problems with ambulating, voiding or po intake.  She denies nausea or vomiting.  Pain is well controlled.  She has had flatus.  Lochia Small.   Objective: Blood pressure 123/73, pulse 97, temperature 98.2 F (36.8 C), temperature source Oral, resp. rate 18, height 5' 4.17" (1.63 m), weight 71.7 kg, last menstrual period 05/30/2020, SpO2 100 %, unknown if currently breastfeeding.  Physical Exam:  General: alert, cooperative and no distress Chest: no respiratory distress Heart:regular rate, distal pulses intact Abdomen: soft, nontender,  Uterine Fundus: firm, appropriately tender DVT Evaluation: No calf swelling or tenderness Extremities: Negative edema Skin: warm, dry  Recent Labs    02/27/21 1334  HGB 12.7  HCT 37.9    Assessment/Plan: Nylene Buford is a 35 y.o. [id number] s/p SVD at [redacted]w[redacted]d   PPD# 1- Doing well Contraception: IUD outpatient  Feeding: Breast  Dispo: Plan for discharge Tomorrow Rhogam needed.    LOS: 2 day   Lezlie Lye, NP 02/28/2021, 7:33 AM

## 2021-02-28 NOTE — Discharge Summary (Signed)
Postpartum Discharge Summary      Patient Name: Vicki Waters DOB: 05/30/1985 MRN: [id number]  Date of admission: 02/27/2021 Delivery date:02/27/2021  Delivering provider: Renard Matter  Date of discharge: 03/01/2021  Admitting diagnosis: Indication for care in labor or delivery [O75.9] Intrauterine pregnancy: [redacted]w[redacted]d    Secondary diagnosis:  Principal Problem:   Indication for care in labor or delivery Active Problems:   Supervision of other normal pregnancy, antepartum   Carrier of spinal muscular atrophy   Rh negative state in antepartum period   Vaginal delivery  Additional problems:  None   Discharge diagnosis: Term Pregnancy Delivered                                              Post partum procedures: None Augmentation:  pitocin Complications: None  Hospital course: Onset of Labor With Vaginal Delivery      35y.o. yo G2P2002 at 334w0das admitted in Active Labor on 02/27/2021. Patient had an uncomplicated labor course as follows:  Membrane Rupture Time/Date: 11:50 AM ,02/27/2021   Delivery Method:Vaginal, Spontaneous  Episiotomy: None  Lacerations:  2nd degree;Perineal  Patient had an uncomplicated postpartum course.  She is ambulating, tolerating a regular diet, passing flatus, and urinating well. Patient is discharged home in stable condition on 03/01/21.  Newborn Data: Birth date:02/27/2021  Birth time:10:52 PM  Gender:Female  Living status:Living  Apgars:7 ,9  Weight:2900 g   Magnesium Sulfate received: No BMZ received: No Rhophylac:Yes MMR:N/A T-DaP:Given prenatally Flu: No Transfusion:No  Physical exam  Vitals:   02/28/21 0156 02/28/21 0607 02/28/21 1010 02/28/21 2101  BP: 120/72 123/73 (!) 106/55 123/76  Pulse: 91 97 96 99  Resp: '18 18 17   ' Temp: 98.3 F (36.8 C) 98.2 F (36.8 C) 98 F (36.7 C) 98.2 F (36.8 C)  TempSrc: Oral Oral Oral Oral  SpO2: 100% 100%  100%  Weight:      Height:       General: alert Lochia:  appropriate Uterine Fundus: firm Incision: N/A DVT Evaluation: No evidence of DVT seen on physical exam. Labs: Lab Results  Component Value Date   WBC 7.3 02/27/2021   HGB 12.7 02/27/2021   HCT 37.9 02/27/2021   MCV 82.9 02/27/2021   PLT 215 02/27/2021   No flowsheet data found. Edinburgh Score: Edinburgh Postnatal Depression Scale Screening Tool 02/28/2021  I have been able to laugh and see the funny side of things. 0  I have looked forward with enjoyment to things. 0  I have blamed myself unnecessarily when things went wrong. 0  I have been anxious or worried for no good reason. 0  I have felt scared or panicky for no good reason. 0  Things have been getting on top of me. 1  I have been so unhappy that I have had difficulty sleeping. 0  I have felt sad or miserable. 0  I have been so unhappy that I have been crying. 0  The thought of harming myself has occurred to me. 0  Edinburgh Postnatal Depression Scale Total 1     After visit meds:  Allergies as of 03/01/2021   No Known Allergies      Medication List     TAKE these medications    acetaminophen 325 MG tablet Commonly known as: Tylenol Take 2 tablets (650 mg total) by mouth every  4 (four) hours as needed (for pain scale < 4).   ibuprofen 600 MG tablet Commonly known as: ADVIL Take 1 tablet (600 mg total) by mouth every 6 (six) hours.   multivitamin-prenatal 27-0.8 MG Tabs tablet Take 1 tablet by mouth daily at 12 noon.         Discharge home in stable condition Infant Feeding: Breast Infant Disposition:home with mother Discharge instruction: per After Visit Summary and Postpartum booklet. Activity: Advance as tolerated. Pelvic rest for 6 weeks.  Diet: routine diet Future Appointments:No future appointments.  Follow up Visit: Message sent to [facility] by Dr. Cy Blamer on 11/25 Please schedule this patient for a In person postpartum visit in 4 weeks with the following provider: Any provider. Additional  Postpartum F/U: None   Low risk pregnancy complicated by:  None Delivery mode:  Vaginal, Spontaneous  Anticipated Birth Control:   Outpatient IUD  Renard Matter, MD, MPH OB Fellow, Faculty Practice

## 2021-02-28 NOTE — Social Work (Signed)
CSW received Verbal Consult "Emergency Medicaid."   CSW provided information regarding the medicaid process. MOB and FOB reported understanding.   Kathrin Greathouse, MSW, LCSW Women's and [place] Worker  [phone] 02/28/2021  3:21 PM

## 2021-03-01 LAB — RH IG WORKUP (INCLUDES ABO/RH)
Fetal Screen: NEGATIVE
Gestational Age(Wks): 39
Unit division: 0

## 2021-03-01 MED ORDER — IBUPROFEN 600 MG PO TABS: 600.0000 mg | ORAL_TABLET | Freq: Four times a day (QID) | ORAL | 0 refills | Status: AC

## 2021-03-01 MED ORDER — ACETAMINOPHEN 325 MG PO TABS: 650.0000 mg | ORAL_TABLET | ORAL | 0 refills | Status: AC | PRN

## 2021-03-12 ENCOUNTER — Telehealth (HOSPITAL_COMMUNITY): Payer: Self-pay | Admitting: *Deleted

## 2021-03-12 NOTE — Telephone Encounter (Signed)
Mom reports feeling good. No concerns about herself at this time. EPDS 9 questions answered with all 0's. Mom didn't understand word 'cope', so one question unanswered. Reports she's not feeling anxious or sad. [facility] score=1) Mom reports baby is doing well. Feeding, peeing, and pooping without difficulty. Safe sleep reviewed. Mom reports no concerns about baby at present.  Odis Hollingshead, RN 03-12-2021 at 2:25pm

## 2021-03-27 ENCOUNTER — Ambulatory Visit: Payer: Self-pay | Admitting: Obstetrics and Gynecology

## 2021-04-24 ENCOUNTER — Other Ambulatory Visit: Payer: Self-pay

## 2021-04-24 ENCOUNTER — Encounter: Payer: Self-pay | Admitting: Obstetrics & Gynecology

## 2021-04-24 ENCOUNTER — Ambulatory Visit (INDEPENDENT_AMBULATORY_CARE_PROVIDER_SITE_OTHER): Payer: Medicaid Other | Admitting: Obstetrics & Gynecology

## 2021-04-24 LAB — POCT PREGNANCY, URINE: Preg Test, Ur: NEGATIVE

## 2021-04-24 NOTE — Progress Notes (Signed)
[place] Partum Visit Note  Vicki Waters is a 36 y.o. G63P2002 female who presents for a postpartum visit. She is 7 weeks postpartum following a normal spontaneous vaginal delivery.  I have fully reviewed the prenatal and intrapartum course. The delivery was at 60 gestational weeks.  Anesthesia: none. Postpartum course has been good. Baby is doing well. Baby is feeding by both breast and bottle - [place] Good Start. Bleeding no bleeding. Bowel function is normal. Bladder function is normal. Patient is sexually active. Contraception method is rhythm method. Postpartum depression screening: negative.   The pregnancy intention screening data noted above was reviewed. Potential methods of contraception were discussed. The patient elected to proceed with No data recorded.   Edinburgh Postnatal Depression Scale - 04/24/21 1439       Edinburgh Postnatal Depression Scale:  In the Past 7 Days   I have been able to laugh and see the funny side of things. 0    I have looked forward with enjoyment to things. 0    I have blamed myself unnecessarily when things went wrong. 0    I have been anxious or worried for no good reason. 0    I have felt scared or panicky for no good reason. 0    Things have been getting on top of me. 0    I have been so unhappy that I have had difficulty sleeping. 0    I have felt sad or miserable. 0    I have been so unhappy that I have been crying. 0    The thought of harming myself has occurred to me. 0    Edinburgh Postnatal Depression Scale Total 0           She will return for Paragard after abstaining 2 weeks  Health Maintenance Due  Topic Date Due   COVID-19 Vaccine (2 - Booster for Janssen series) 10/30/2020    The following portions of the patient's history were reviewed and updated as appropriate: allergies, current medications, past family history, past medical history, past social history, past surgical history, and problem list.  Review of  Systems Pertinent items are noted in HPI.  Objective:  BP 118/77    Pulse 97    Ht 5\' 4"  (1.626 m)    Wt 152 lb 8 oz (69.2 kg)    LMP 05/30/2020 (Exact Date)    BMI 26.18 kg/m    General:  alert, cooperative, and no distress   Breasts:  normal  Lungs:   Heart:    Abdomen: soft, non-tender; bowel sounds normal; no masses,  no organomegaly   Wound   GU exam:  not indicated       Assessment:    There are no diagnoses linked to this encounter.  normal postpartum exam.   Plan:   Essential components of care per ACOG recommendations:  1.  Mood and well being: Patient with negative depression screening today. Reviewed local resources for support.  - Patient tobacco use? No.   - hx of drug use? No.    2. Infant care and feeding:  -Patient currently breastmilk feeding?   -Social determinants of health (SDOH) reviewed in EPIC. No concerns 3. Sexuality, contraception and birth spacing - Patient does not want a pregnancy in the next year.  Desired family size is 3 children.  - Reviewed forms of contraception in tiered fashion. Patient desired IUD today.   - Discussed birth spacing of 18 months  4. Sleep and fatigue -  Encouraged family/partner/community support of 4 hrs of uninterrupted sleep to help with mood and fatigue  5. Physical Recovery  - Discussed patients delivery and complications. She describes her labor as good. - Patient had a Vaginal, no problems at delivery. Patient had a 2nd degree laceration. Perineal healing reviewed. Patient expressed understanding - Patient has urinary incontinence? No. - Patient is safe to resume physical and sexual activity  6.  Health Maintenance - HM due items addressed Yes - Last pap smear  Diagnosis  Date Value Ref Range Status  10/08/2020   Final   - Negative for intraepithelial lesion or malignancy (NILM)   Pap smear not done at today's visit.  -Breast Cancer screening indicated? No.   7. Chronic Disease/Pregnancy Condition  follow up: None Paragard at next visit - PCP follow up  Emeterio Reeve, [place] for [place], [place]

## 2021-05-14 ENCOUNTER — Other Ambulatory Visit: Payer: Self-pay

## 2021-05-14 ENCOUNTER — Encounter: Payer: Self-pay | Admitting: Family Medicine

## 2021-05-14 ENCOUNTER — Ambulatory Visit (INDEPENDENT_AMBULATORY_CARE_PROVIDER_SITE_OTHER): Payer: Medicaid Other | Admitting: Family Medicine

## 2021-05-14 VITALS — BP 119/84 | HR 99 | Wt 155.1 lb

## 2021-05-14 DIAGNOSIS — Z30430 Encounter for insertion of intrauterine contraceptive device: Secondary | ICD-10-CM

## 2021-05-14 DIAGNOSIS — Z3202 Encounter for pregnancy test, result negative: Secondary | ICD-10-CM

## 2021-05-14 DIAGNOSIS — Z975 Presence of (intrauterine) contraceptive device: Secondary | ICD-10-CM

## 2021-05-14 LAB — POCT PREGNANCY, URINE: Preg Test, Ur: NEGATIVE

## 2021-05-14 MED ORDER — POLYETHYLENE GLYCOL 3350 17 GM/SCOOP PO POWD: 17.0000 g | Freq: Every day | ORAL | 1 refills | Status: AC | PRN

## 2021-05-14 MED ORDER — PARAGARD INTRAUTERINE COPPER IU IUD
1.0000 | INTRAUTERINE_SYSTEM | Freq: Once | INTRAUTERINE | Status: AC
  Administered 2021-05-14: 1 via INTRAUTERINE

## 2021-05-14 NOTE — Progress Notes (Signed)
° ° °  GYNECOLOGY OFFICE PROCEDURE NOTE  Vicki Waters is a 36 y.o. [id number] here for Paragard IUD insertion. No GYN concerns.  Last pap smear:  Lab Results  Component Value Date   DIAGPAP  10/08/2020    - Negative for intraepithelial lesion or malignancy (NILM)   [place] Negative 10/08/2020    IUD Insertion Procedure Note Patient identified, informed consent performed, consent signed.   Discussed risks of irregular bleeding, heavier/longer periods, increased cramping, infection, malpositioning or misplacement of the IUD outside the uterus which may require further procedure such as laparoscopy. Also discussed >99% contraception efficacy, increased risk of ectopic pregnancy with failure of method.  Time out was performed.  Urine pregnancy test negative.  Speculum placed in the vagina.  Cervix visualized.  Cleaned with Betadine x 2.  Grasped anteriorly with a single tooth tenaculum.  Uterus sounded to 8 cm.  Paragard IUD placed per manufacturer's recommendations.  Strings trimmed to 3 cm. Tenaculum was removed, good hemostasis noted.  Patient tolerated procedure well.   Patient was given post-procedure instructions.  She was advised to have backup contraception for one week.  Patient was also asked to check IUD strings periodically and follow up in 4 weeks for IUD check.   Clarnce Flock, MD/MPH Attending Family Medicine Physician, [facility] for [facility], [place]

## 2021-05-14 NOTE — Addendum Note (Signed)
Addended by: Seth Bake on: 05/14/2021 10:20 AM   Modules accepted: Orders

## 2021-06-18 ENCOUNTER — Ambulatory Visit: Payer: Medicaid Other | Admitting: Family Medicine

## 2023-05-14 IMAGING — US US MFM OB COMP +14 WKS
1 series · 13 of 28 positions shown · non-contrast
Comparison: none

[Series 1: us mfm ob comp +14 wks · 13 of 119 slices shown]
[im 5/119]
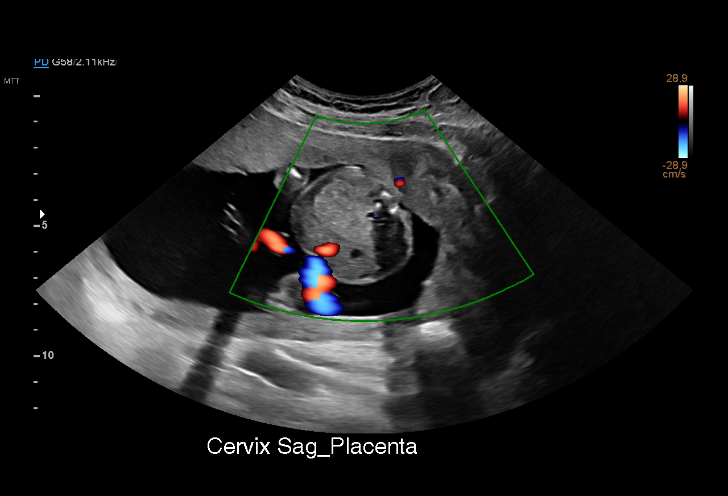
[im 14/119]
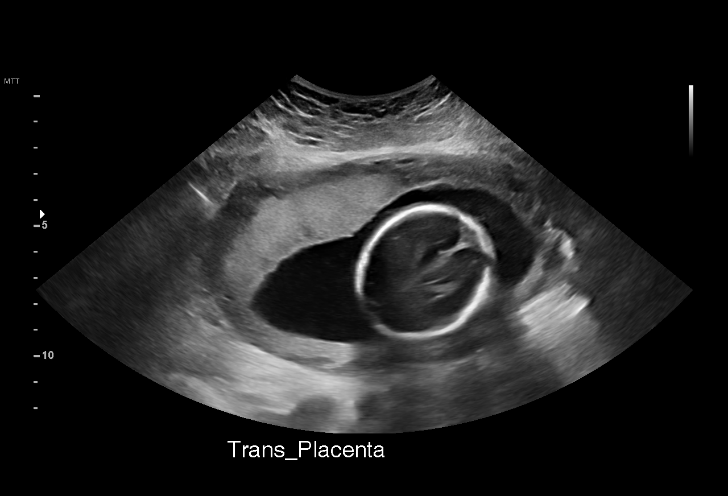
[im 22/119]
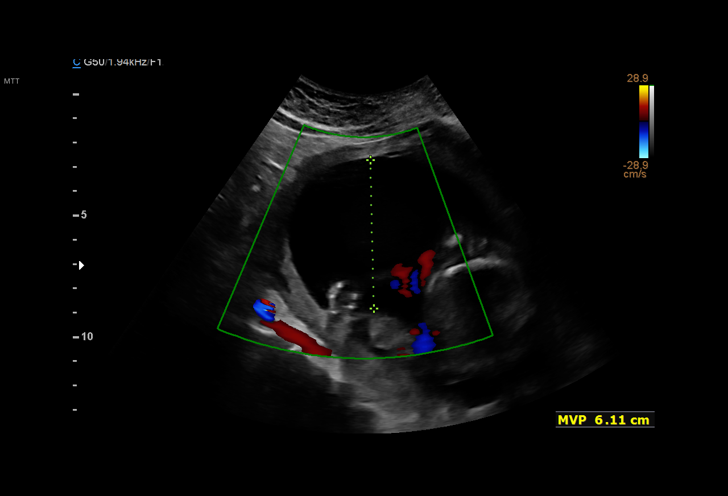
[im 31/119]
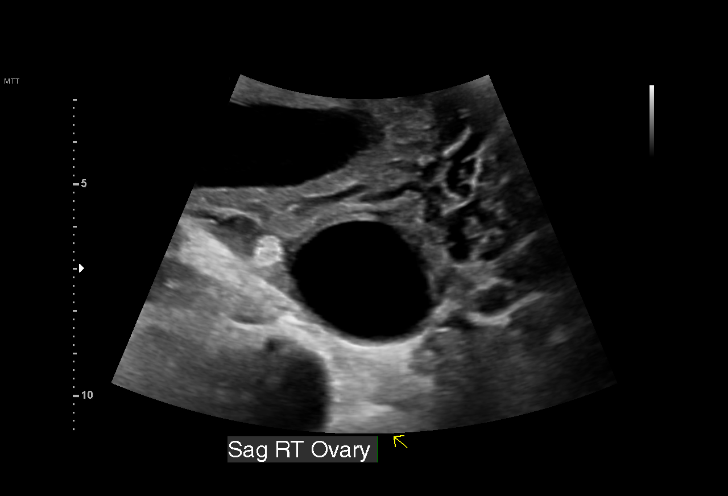
[im 40/119]
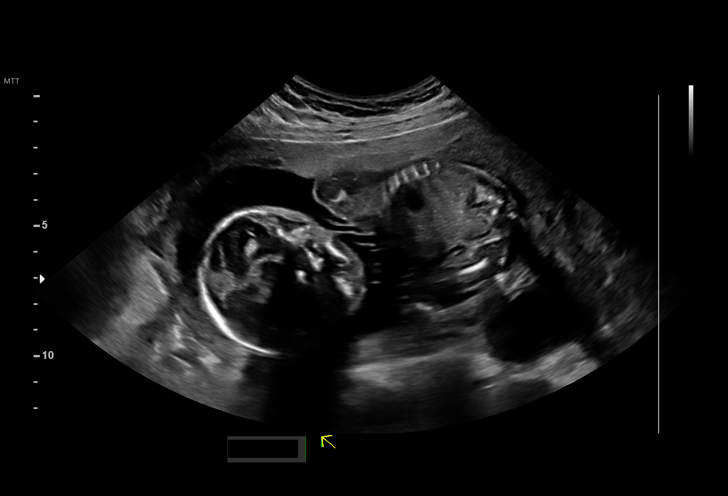
[im 49/119]
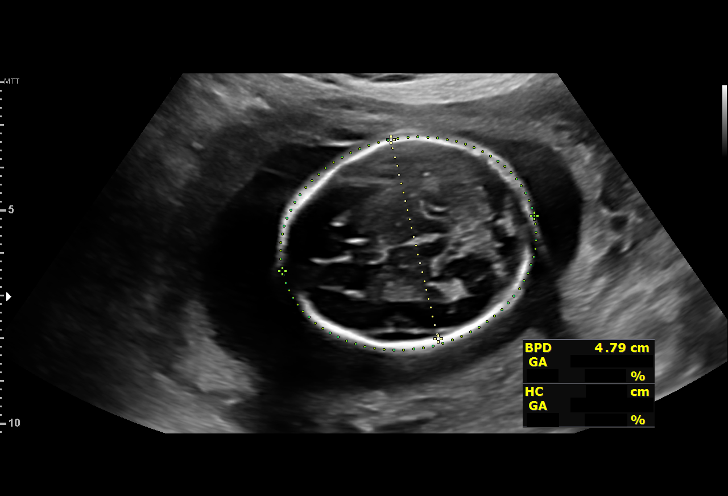
[im 62/119]
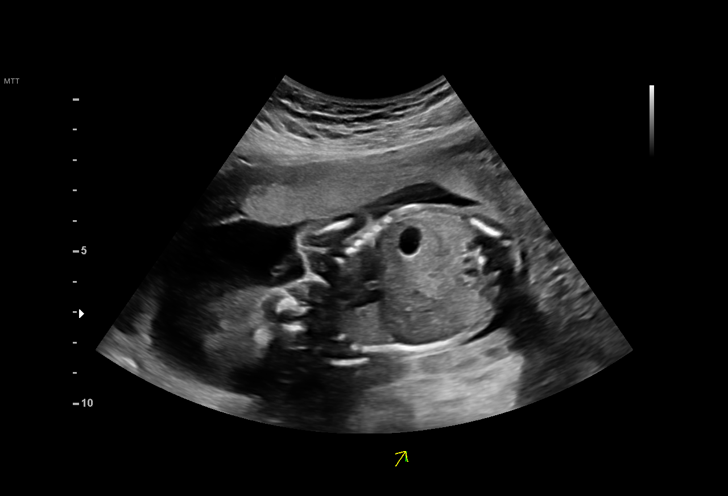
[im 70/119]
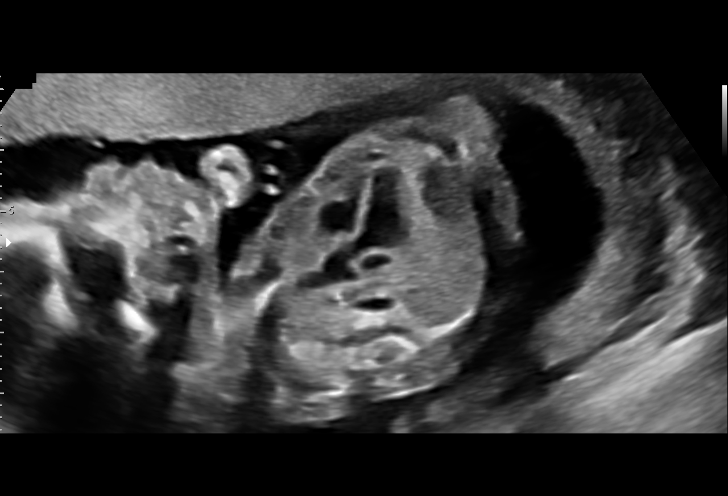
[im 79/119]
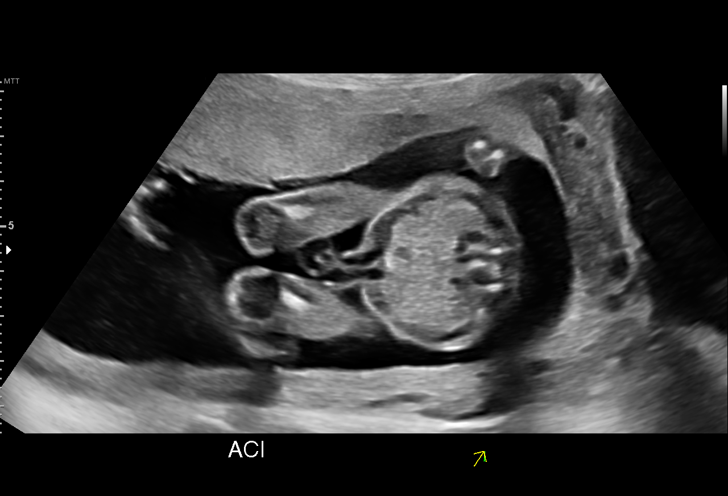
[im 88/119]
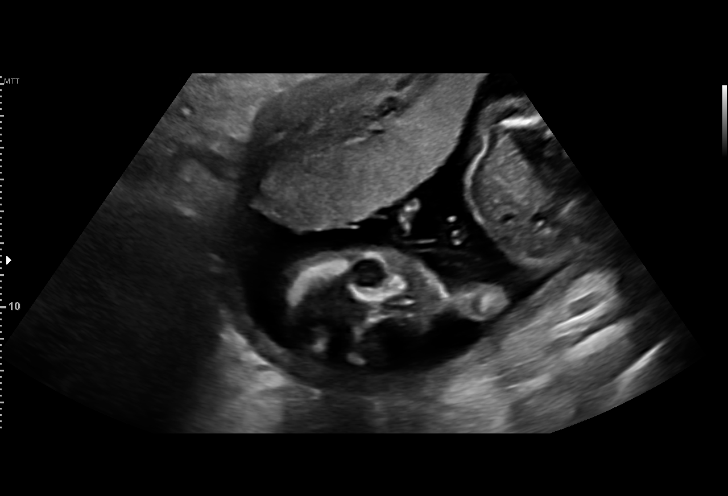
[im 97/119]
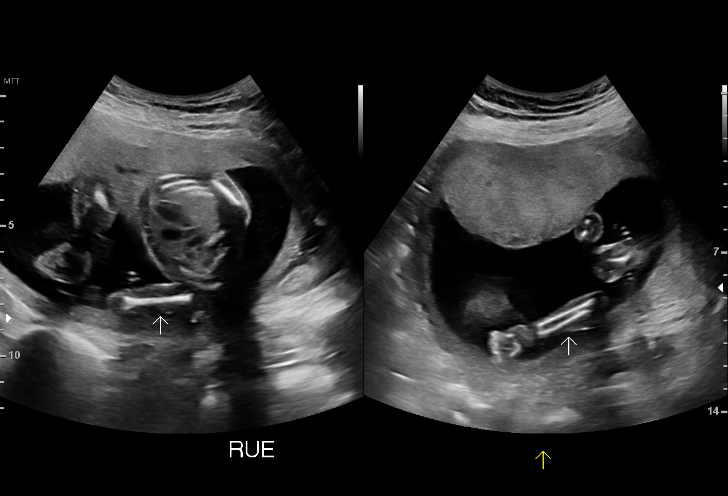
[im 105/119]
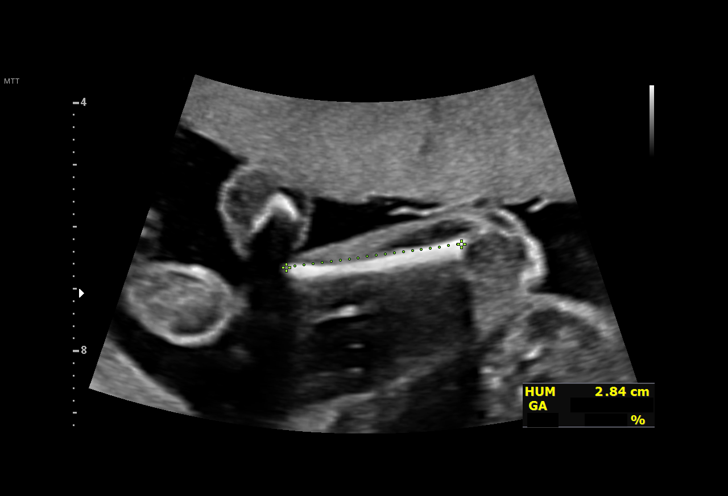
[im 114/119]
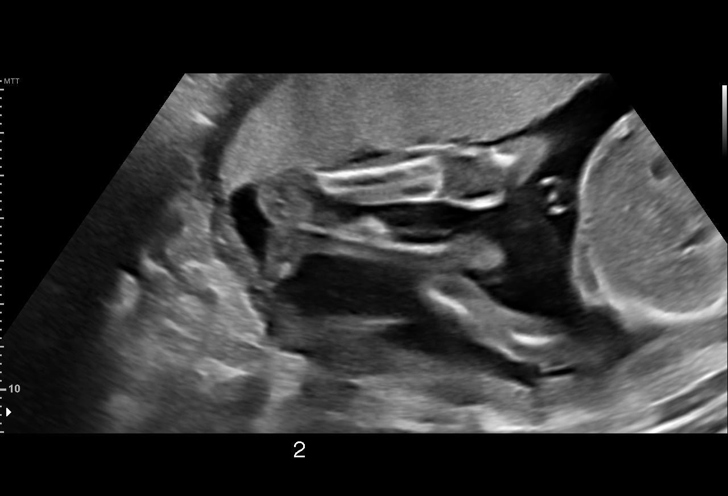

[13 of 28 positions shown; findings below may reference images not displayed]

[REDACTED]care

 1  US MFM OB COMP + 14 WK                76805.01    IVON FALLER

Indications

 Encounter for antenatal screening for
 malformations
 19 weeks gestation of pregnancy
 Rh negative state in antepartum
 Ovarian dermoid cyst complicating              O34.80,
 pregnancy, antepartum
 Negative AFP
Fetal Evaluation

 Num Of Fetuses:         1
 Fetal Heart Rate(bpm):  153
 Cardiac Activity:       Observed
 Presentation:           Breech
 Placenta:               Anterior Right
 P. Cord Insertion:      Visualized, central

 Amniotic Fluid
 AFI FV:      Within normal limits

                             Largest Pocket(cm)

Biometry
 BPD:      48.2  mm     G. Age:  20w 4d         78  %    CI:        75.35   %    70 - 86
                                                         FL/HC:      18.2   %    16.8 -
 HC:      176.1  mm     G. Age:  20w 1d         54  %    HC/AC:      1.16        1.09 -
 AC:      151.6  mm     G. Age:  20w 3d         62  %    FL/BPD:     66.4   %
 FL:         32  mm     G. Age:  20w 0d         46  %    FL/AC:      21.1   %    20 - 24
 HUM:      28.7  mm     G. Age:  19w 2d         39  %
 CER:      20.5  mm     G. Age:  19w 5d         62  %
 NFT:       3.0  mm

 LV:        7.1  mm
 CM:        5.3  mm

 Est. FW:     338  gm    0 lb 12 oz      65  %
OB History

 Blood Type:   O-
 Gravidity:    2         Term:   1        Prem:   0        SAB:   0
 TOP:          0       Ectopic:  0        Living: 1
Gestational Age

 LMP:           19w 6d        Date:  05/30/20                 EDD:   03/06/21
 U/S Today:     20w 2d                                        EDD:   03/03/21
 Best:          19w 6d     Det. By:  LMP  (05/30/20)          EDD:   03/06/21
Anatomy

 Cranium:               Appears normal         LVOT:                   Appears normal
 Cavum:                 Appears normal         Aortic Arch:            Not well visualized
 Ventricles:            Appears normal         Ductal Arch:            Appears normal
 Choroid Plexus:        Appears normal         Diaphragm:              Appears normal
 Cerebellum:            Appears normal         Stomach:                Appears normal, left
                                                                       sided
 Posterior Fossa:       Appears normal         Abdomen:                Appears normal
 Nuchal Fold:           Appears normal         Abdominal Wall:         Appears nml (cord
                                                                       insert, abd wall)
 Face:                  Orbits nl; profile not Cord Vessels:           Appears normal (3
                        well visualized                                vessel cord)
 Lips:                  Appears normal         Kidneys:                Appear normal
 Palate:                Not well visualized    Bladder:                Appears normal
 Thoracic:              Appears normal         Spine:                  Not well visualized
 Heart:                 Appears normal         Upper Extremities:      Appears normal
                        (4CH, axis, and
                        situs)
 RVOT:                  Appears normal         Lower Extremities:      Appears normal

 Other:  Fetus appears to be female. Technically difficult due to fetal position.
Cervix Uterus Adnexa

 Cervix
 Length:            3.9  cm.
 Normal appearance by transabdominal scan.

 Uterus
 No abnormality visualized.
 Right Ovary
 Simple cyst, measuring 3.5 cm. Simple cyst, measuring 2.2 cm. Cystic
 teratoma 0.7cm.

 Left Ovary
 Within normal limits.

 Cul De Sac
 No free fluid seen.

 Adnexa
 No abnormality visualized.
Impression

 G2 P1.  Patient is here for fetal anatomy scan.  Cell free fetal
 DNA screening is pending.  MSAFP screening showed low
 risk for fetal aneuploidies.

 Obstetrical history significant for a term vaginal delivery in
 2204 of a male infant.  Her son is in good health.  She had
 postpartum hemorrhage.

 We performed a fetal anatomy scan. No markers of
 aneuploidies or fetal structural defects are seen. Fetal
 biometry is consistent with her previously-established dates.
 Amniotic fluid is normal and good fetal activity is seen.

 A small simple right ovarian cyst is seen.  Additional
 echogenic area in the right ovary consistent with dermoid cyst
 is seen.  Patient understands the limitations of ultrasound in
 detecting fetal anomalies.

 Patient is asymptomatic and does not have abdominal pain.  I
 explained the findings and reassured her that the cyst is likely
 to be benign.
Recommendations

 -An appointment was made for her to return in 4 weeks for
 completion of fetal anatomy.
                 Hage, Bello

## 2023-06-23 IMAGING — US US MFM OB FOLLOW-UP
1 series · 15 of 28 positions shown · non-contrast
Comparison: none

[Series 1: us mfm ob follow-up · 110 acquisitions, 15 frames shown]
[im 1/110]
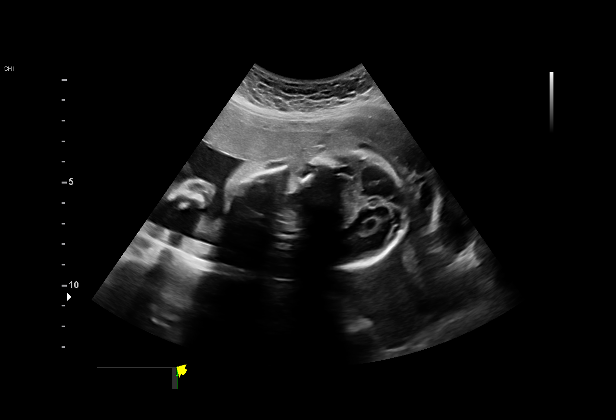
[im 9/110]
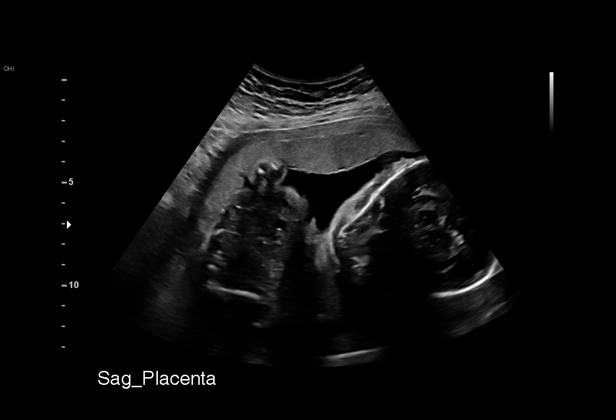
[im 17/110]
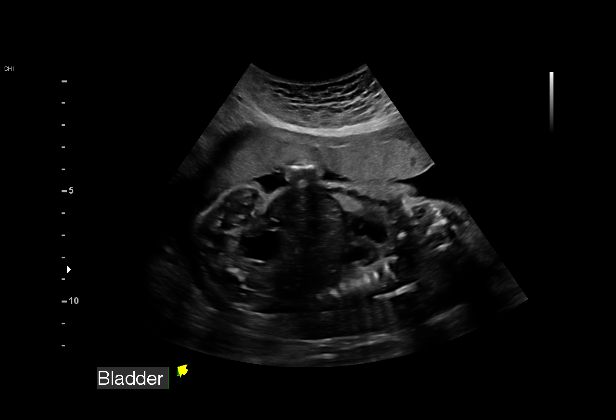
[im 25/110]
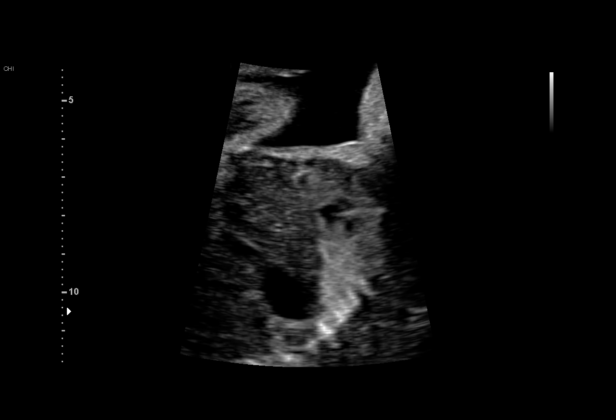
[im 33/110]
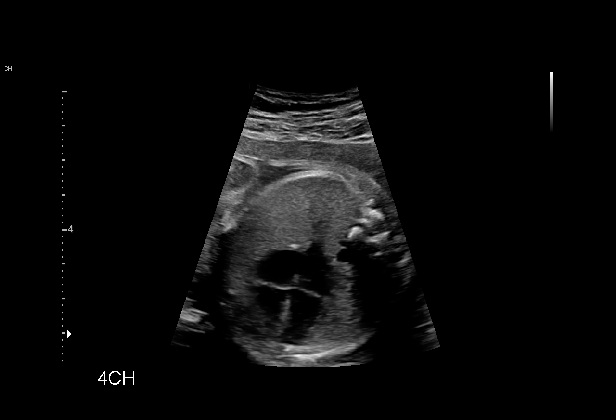
[im 41/110]
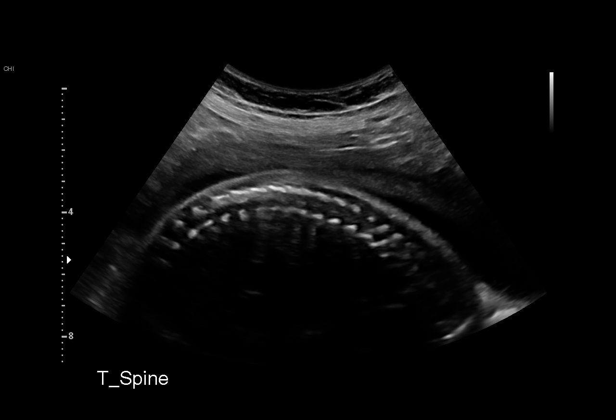
[im 49/110]
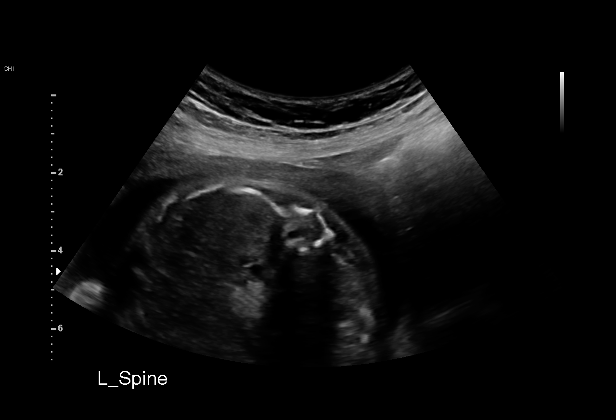
[im 57/110]
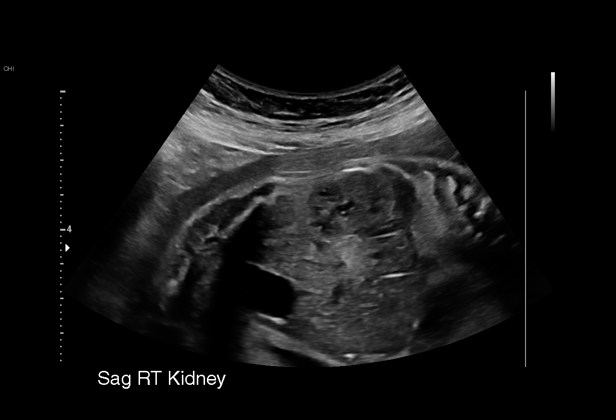
[im 61/110]
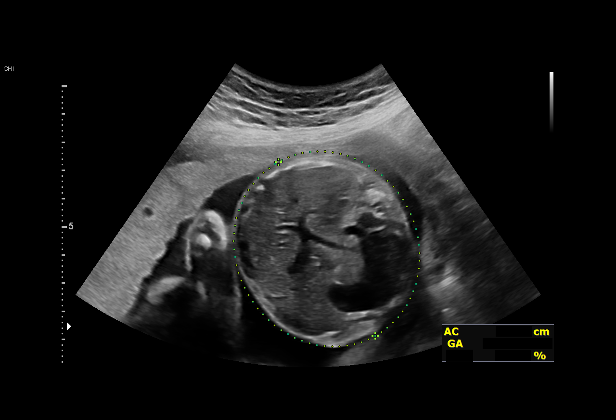
[im 69/110]
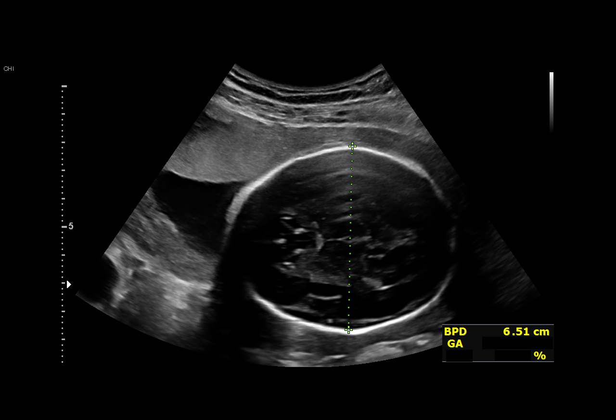
[im 77/110]
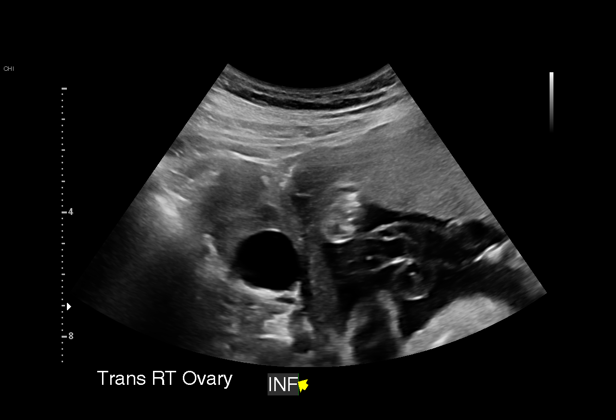
[im 85/110]
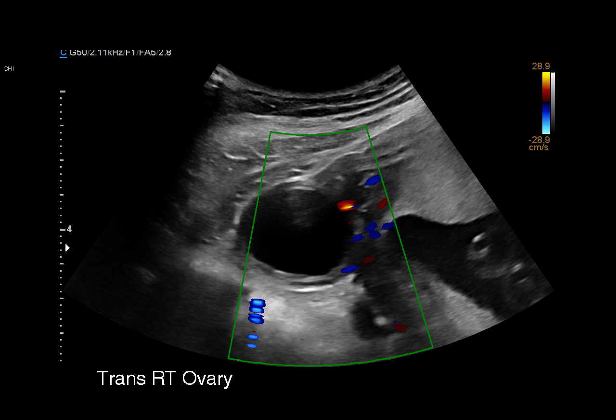
[im 93/110]
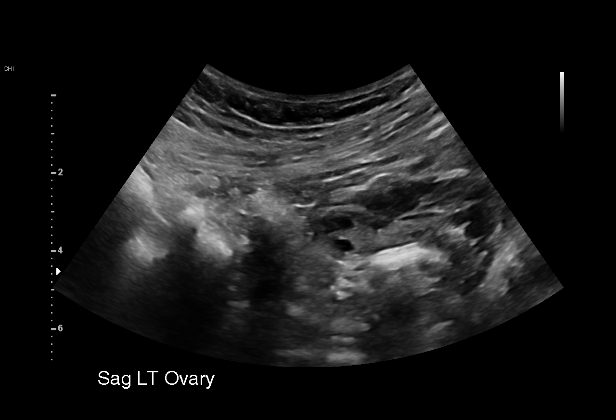
[im 101/110]
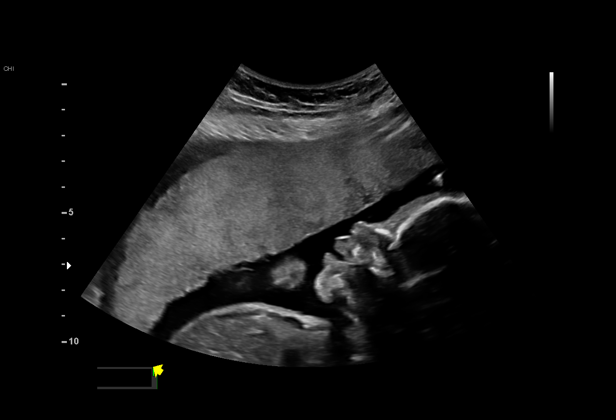
[im 110/110]
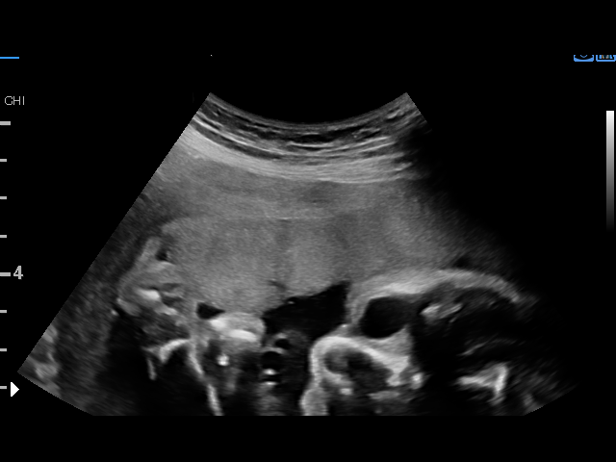

[15 of 28 positions shown; findings below may reference images not displayed]

Addendum:\.br----------------------------------------------------------------------

                   [REDACTED]care

Indications

 Antenatal follow-up for nonvisualized fetal
 anatomy
 Rh negative state in antepartum
 Ovarian dermoid cyst complicating              O34.80,
 pregnancy, antepartum
 Negative AFP
 25 weeks gestation of pregnancy
Fetal Evaluation

 Num Of Fetuses:         1
 Fetal Heart Rate(bpm):  147
 Cardiac Activity:       Observed
 Presentation:           Cephalic
 Placenta:               Anterior Right Lateral
 P. Cord Insertion:      Previously Visualized

 Amniotic Fluid
 AFI FV:      Within normal limits

                             Largest Pocket(cm)

Biometry
 BPD:        65  mm     G. Age:  26w 2d         65  %    CI:        76.36   %    70 - 86
                                                         FL/HC:      20.4   %    18.6 -
 HC:      235.7  mm     G. Age:  25w 4d         28  %    HC/AC:      1.11        1.04 -
 AC:      213.3  mm     G. Age:  25w 6d         50  %    FL/BPD:     74.0   %    71 - 87
 FL:       48.1  mm     G. Age:  26w 1d         53  %    FL/AC:      22.6   %    20 - 24
 LV:          6  mm

 Est. FW:     872  gm    1 lb 15 oz      56  %
OB History

 Blood Type:   O-
 Gravidity:    2         Term:   1        Prem:   0        SAB:   0
 TOP:          0       Ectopic:  0        Living: 1
Gestational Age

 LMP:           25w 4d        Date:  05/30/20                 EDD:   03/06/21
 U/S Today:     26w 0d                                        EDD:   03/03/21
 Best:          25w 4d     Det. By:  LMP  (05/30/20)          EDD:   03/06/21
Anatomy

 Cranium:               Appears normal         LVOT:                   Previously seen
 Cavum:                 Appears normal         Aortic Arch:            Appears normal
 Ventricles:            Appears normal         Ductal Arch:            Previously seen
 Choroid Plexus:        Previously seen        Diaphragm:              Appears normal
 Cerebellum:            Previously seen        Stomach:                Appears normal, left
                                                                       sided
 Posterior Fossa:       Previously seen        Abdomen:                Appears normal
 Nuchal Fold:           Previously seen        Abdominal Wall:         Previously seen
 Face:                  Appears normal         Cord Vessels:           Appears normal (3
                        (orbits and profile)                           vessel cord)
 Lips:                  Appears normal         Kidneys:                Appear normal
 Palate:                Not well visualized    Bladder:                Appears normal
 Thoracic:              Appears normal         Spine:                  Appears normal
 Heart:                 Appears normal         Upper Extremities:      Previously seen
                        (4CH, axis, and
                        situs)
 RVOT:                  Previously seen        Lower Extremities:      Previously seen

 Other:  Fetus appears to be female. VC, 3VV and 3VTV visualized. Nasal
         bone and lenses visualized.
Cervix Uterus Adnexa

 Cervix
 Length:           3.02  cm.
 Normal appearance by transabdominal scan.

 Uterus
 No abnormality visualized.

 Right Ovary
 Ovarian cysts; inferior-medial,2.2 sagH3.ECL X2.1 cmtrsv/Superior,
 lateral 2.8 [id number]
 Left Ovary
 Within normal limits.

 Cul De Sac
 No free fluid seen.

 Adnexa
 No abnormality visualized.
Impression

 Follow up growth due to complete the fetal anatomy
 Normal interval growth with measurements consistent with
 dates
 Good fetal movement and amniotic fluid volume

 Anatomy is completed today.

 Dermoid was again seen. Ms. Folk is asymptomatic. She
 was counseled on signs and symptoms of ovarian torsion.

 Ms. Folk is an SMA carrier and desires genetic counseling.
Recommendations

 Follow up growth as clinically indicated.
 Genetic counseling scheduled.

*** End of Addendum ***\.br----------------------------------------------------------------------

                   [REDACTED]care

Indications

 Antenatal follow-up for nonvisualized fetal
 anatomy
 Rh negative state in antepartum
 Ovarian dermoid cyst complicating              O34.80,
 pregnancy, antepartum
 Negative AFP
 25 weeks gestation of pregnancy
Fetal Evaluation

 Num Of Fetuses:         1
 Fetal Heart Rate(bpm):  147
 Cardiac Activity:       Observed
 Presentation:           Cephalic
 Placenta:               Anterior Right Lateral
 P. Cord Insertion:      Previously Visualized

 Amniotic Fluid
 AFI FV:      Within normal limits

                             Largest Pocket(cm)

Biometry
 BPD:        65  mm     G. Age:  26w 2d         65  %    CI:        76.36   %    70 - 86
                                                         FL/HC:      20.4   %    18.6 -
 HC:      235.7  mm     G. Age:  25w 4d         28  %    HC/AC:      1.11        1.04 -
 AC:      213.3  mm     G. Age:  25w 6d         50  %    FL/BPD:     74.0   %    71 - 87
 FL:       48.1  mm     G. Age:  26w 1d         53  %    FL/AC:      22.6   %    20 - 24
 LV:          6  mm

 Est. FW:     872  gm    1 lb 15 oz      56  %
OB History

 Blood Type:   O-
 Gravidity:    2         Term:   1        Prem:   0        SAB:   0
 TOP:          0       Ectopic:  0        Living: 1
Gestational Age

 LMP:           25w 4d        Date:  05/30/20                 EDD:   03/06/21
 U/S Today:     26w 0d                                        EDD:   03/03/21
 Best:          25w 4d     Det. By:  LMP  (05/30/20)          EDD:   03/06/21
Anatomy

 Cranium:               Appears normal         LVOT:                   Previously seen
 Cavum:                 Appears normal         Aortic Arch:            Appears normal
 Ventricles:            Appears normal         Ductal Arch:            Previously seen
 Choroid Plexus:        Previously seen        Diaphragm:              Appears normal
 Cerebellum:            Previously seen        Stomach:                Appears normal, left
                                                                       sided
 Posterior Fossa:       Previously seen        Abdomen:                Appears normal
 Nuchal Fold:           Previously seen        Abdominal Wall:         Previously seen
 Face:                  Appears normal         Cord Vessels:           Appears normal (3
                        (orbits and profile)                           vessel cord)
 Lips:                  Appears normal         Kidneys:                Appear normal
 Palate:                Not well visualized    Bladder:                Appears normal
 Thoracic:              Appears normal         Spine:                  Appears normal
 Heart:                 Appears normal         Upper Extremities:      Previously seen
                        (4CH, axis, and
                        situs)
 RVOT:                  Previously seen        Lower Extremities:      Previously seen

 Other:  Fetus appears to be female. VC, 3VV and 3VTV visualized. Nasal
         bone and lenses visualized.
Cervix Uterus Adnexa

 Cervix
 Length:           3.02  cm.
 Normal appearance by transabdominal scan.

 Uterus
 No abnormality visualized.

 Right Ovary
 Ovarian cysts; inferior-medial,2.2 sagH3.ECL X2.1 cmtrsv/Superior,
 lateral 2.8 [id number]
 Left Ovary
 Within normal limits.

 Cul De Sac
 No free fluid seen.

 Adnexa
 No abnormality visualized.
Impression

 Follow up growth due to complete the fetal anatomy
 Normal interval growth with measurements consistent with
 dates
 Good fetal movement and amniotic fluid volume

 Anatomy is completed today.

 Dermoid was again seen. Ms. Folk is asymptomatic. She
 was counseled on signs and symptoms of ovarian torsion.
Recommendations

 Follow up growth as clinically indicated.
